# Patient Record
Sex: Female | Born: 1995 | Race: White | Hispanic: No | Marital: Single | Smoking: Never smoker
Health system: Southern US, Community
[De-identification: ages and names within clinical notes are randomized; demographics above are authoritative.]

## PROBLEM LIST (undated history)

## (undated) DIAGNOSIS — D649 Anemia, unspecified: Secondary | ICD-10-CM

## (undated) HISTORY — PX: KIDNEY SURGERY: SHX687

## (undated) HISTORY — DX: Anemia, unspecified: D64.9

---
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
## 2016-08-10 DIAGNOSIS — K2100 Gastro-esophageal reflux disease with esophagitis, without bleeding: Secondary | ICD-10-CM | POA: Insufficient documentation

## 2016-11-22 ENCOUNTER — Ambulatory Visit: Payer: Self-pay | Admitting: Physician Assistant

## 2016-11-22 ENCOUNTER — Encounter: Payer: Self-pay | Admitting: Physician Assistant

## 2016-11-22 ENCOUNTER — Ambulatory Visit (INDEPENDENT_AMBULATORY_CARE_PROVIDER_SITE_OTHER): Payer: BLUE CROSS/BLUE SHIELD | Admitting: Physician Assistant

## 2016-11-22 VITALS — BP 104/78 | HR 84 | Temp 98.7°F | Resp 16 | Wt 178.0 lb

## 2016-11-22 DIAGNOSIS — R42 Dizziness and giddiness: Secondary | ICD-10-CM

## 2016-11-22 DIAGNOSIS — K602 Anal fissure, unspecified: Secondary | ICD-10-CM | POA: Diagnosis not present

## 2016-11-22 DIAGNOSIS — K644 Residual hemorrhoidal skin tags: Secondary | ICD-10-CM

## 2016-11-22 NOTE — Progress Notes (Signed)
Patient: Alexandra Haney Female    DOB: 05/31/1996   20 y.o.   MRN: [id number] Visit Date: 11/22/2016  Today's Provider: Trey SailorsAdriana M Pollak, PA-C   Chief Complaint  Patient presents with  . Hemorrhoids  . Constipation   Subjective:      Hemorrhoids: Patient complains of evaluation of possible hemorrhoids. Onset of symptoms was gradual starting several years ago ago with gradually worsening course since that time.  She describes symptoms as anorectal itching, bleeding which only occurs with bowel movements and constipation. Treatment to date has been OTC creams: temporarily effective. Patient denies family hx of colorectal CA, history of previous STDs, known or suspected STD exposure, maroon colored stools, melena, receptive anal intercourse and weight loss.  No family history of GI malignancy. No personal history of weight loss, fevers, chills, night sweats. Blood is never in stool, only in toilet or on tissue. Bleeding never persistent and usually resolves in 1-2 days.   Constipation  This is a chronic problem. The current episode started more than 1 year ago. The problem has been gradually worsening since onset. The patient is on a high fiber diet. She does not exercise regularly. There has been adequate water intake. Associated symptoms include abdominal pain, bloating, hemorrhoids and rectal pain. Pertinent negatives include no diarrhea, fever, nausea or vomiting. She has tried laxatives, fiber and diet changes for the symptoms. The treatment provided no relief.      Allergies  Allergen Reactions  . Cephalosporins Hives and Itching     Current Outpatient Prescriptions:  .  ranitidine (ZANTAC) 150 MG tablet, Take 150 mg by mouth as needed for heartburn., Disp: , Rfl:   Review of Systems  Constitutional: Positive for appetite change and fatigue. Negative for activity change, chills, diaphoresis, fever and unexpected weight change.  HENT: Negative for sore throat and  trouble swallowing.   Gastrointestinal: Positive for abdominal distention, abdominal pain, bloating, constipation, hemorrhoids and rectal pain. Negative for anal bleeding, blood in stool, diarrhea, nausea and vomiting.  Endocrine: Positive for heat intolerance, polydipsia and polyuria. Negative for cold intolerance and polyphagia.  Musculoskeletal: Negative for neck pain and neck stiffness.  Neurological: Positive for dizziness and headaches.    Social History  Substance Use Topics  . Smoking status: Never Smoker  . Smokeless tobacco: Never Used  . Alcohol use No   Objective:   BP 104/78 (BP Location: Left Arm, Patient Position: Sitting, Cuff Size: Normal)   Pulse 84   Temp 98.7 F (37.1 C) (Oral)   Resp 16   Wt 178 lb (80.7 kg)   LMP 11/14/2016   Physical Exam  Constitutional: She appears well-developed and well-nourished.  Cardiovascular: Normal rate.   Abdominal: Soft. Bowel sounds are normal. She exhibits no distension. There is no tenderness. There is no rebound and no guarding.  Genitourinary: Rectal exam shows external hemorrhoid and fissure.    Pelvic exam was performed with patient in the knee-chest position.  Nursing note and vitals reviewed.       Assessment & Plan:      Problem List Items Addressed This Visit    None    Visit Diagnoses    Dizziness    -  Primary   Relevant Orders   CBC   External hemorrhoid       Anal fissure         Patient is 20 y/o female presenting with anal fissue and external hemorrhoid. Hemorrhoid and anal fissure  visualized on exam. Patient declined to proceed with DRE after these visualized. No red flag symptoms such as weight loss, fever, chills, nausea, vomiting. Bleeding likely from the above diagnoses, exacerbated by constipation. Patient reports high fiber diet. Recommend miralax for 2 weeks during hemorrhoid flares. Otherwise, maintain high fiber diet and increased water intake. Counseled on warning symptoms needing further  workup. Will get CBC to check hemoglobin. Patient may do sitz bath for pain relief.   Return if symptoms worsen or fail to improve.   Patient Instructions      Anal Fissure, Adult Introduction An anal fissure is a small tear or crack in the skin around the opening of the butt (anus).Bleeding from the tear or crack usually stops on its own within a few minutes. The bleeding may happen every time you poop (have a bowel movement) until the tear or crack heals. Follow these instructions at home: Eating and drinking  Avoid bananas and dairy products. These foods can make it hard to poop.  Drink enough fluid to keep your pee (urine) clear or pale yellow.  Eat a lot of fruit, whole grains, and vegetables. General instructions  Keep the butt area as clean and dry as you can.  Take a warm water bath (sitz bath) as told by your doctor. Do not use soap.  Take over-the-counter and prescription medicines only as told by your doctor.  Use creams or ointments only as told by your doctor.  Keep all follow-up visits as told by your doctor. This is important. Contact a doctor if:  You have more bleeding.  You have a fever.  You have watery poop (diarrhea) that is mixed with blood.  You have pain.  You problem gets worse, not better. This information is not intended to replace advice given to you by your health care provider. Make sure you discuss any questions you have with your health care provider. Document Released: 08/10/2011 Document Revised: 05/19/2016 Document Reviewed: 03/09/2015  2017 Elsevier    The entirety of the information documented in the History of Present Illness, Review of Systems and Physical Exam were personally obtained by me. Portions of this information were initially documented by Kavin LeechLaura Sarika Baldini, CMA and reviewed by me for thoroughness and accuracy.           Trey SailorsAdriana M Pollak, PA-C  [facility]

## 2016-11-22 NOTE — Patient Instructions (Signed)
Anal Fissure, Adult Introduction An anal fissure is a small tear or crack in the skin around the opening of the butt (anus).Bleeding from the tear or crack usually stops on its own within a few minutes. The bleeding may happen every time you poop (have a bowel movement) until the tear or crack heals. Follow these instructions at home: Eating and drinking  Avoid bananas and dairy products. These foods can make it hard to poop.  Drink enough fluid to keep your pee (urine) clear or pale yellow.  Eat a lot of fruit, whole grains, and vegetables. General instructions  Keep the butt area as clean and dry as you can.  Take a warm water bath (sitz bath) as told by your doctor. Do not use soap.  Take over-the-counter and prescription medicines only as told by your doctor.  Use creams or ointments only as told by your doctor.  Keep all follow-up visits as told by your doctor. This is important. Contact a doctor if:  You have more bleeding.  You have a fever.  You have watery poop (diarrhea) that is mixed with blood.  You have pain.  You problem gets worse, not better. This information is not intended to replace advice given to you by your health care provider. Make sure you discuss any questions you have with your health care provider. Document Released: 08/10/2011 Document Revised: 05/19/2016 Document Reviewed: 03/09/2015  2017 Elsevier  

## 2016-11-23 ENCOUNTER — Telehealth: Payer: Self-pay

## 2016-11-23 LAB — CBC
Hematocrit: 39.8 % (ref 34.0–46.6)
Hemoglobin: 13.1 g/dL (ref 11.1–15.9)
MCH: 28.1 pg (ref 26.6–33.0)
MCHC: 32.9 g/dL (ref 31.5–35.7)
MCV: 85 fL (ref 79–97)
Platelets: 268 10*3/uL (ref 150–379)
RBC: 4.67 x10E6/uL (ref 3.77–5.28)
RDW: 13 % (ref 12.3–15.4)
WBC: 10.3 10*3/uL (ref 3.4–10.8)

## 2016-11-23 NOTE — Telephone Encounter (Signed)
[id number] 

## 2016-11-23 NOTE — Telephone Encounter (Signed)
Patients mother (listed on DPR Jeanene ErbMichelle Joy) was advised of lab report. KW

## 2016-11-23 NOTE — Telephone Encounter (Signed)
-----   Message from Trey SailorsAdriana M Pollak, [place]-C sent at 11/23/2016  8:14 AM EST ----- Blood count was normal. Patient should continue with fiber, Miralax, and Sitz baths as we talked about in office. Patient may call back if symptoms persist or worsen.

## 2018-04-02 ENCOUNTER — Ambulatory Visit (INDEPENDENT_AMBULATORY_CARE_PROVIDER_SITE_OTHER): Payer: BLUE CROSS/BLUE SHIELD | Admitting: Certified Nurse Midwife

## 2018-04-02 ENCOUNTER — Encounter: Payer: Self-pay | Admitting: Certified Nurse Midwife

## 2018-04-02 VITALS — BP 93/67 | HR 66 | Ht 63.0 in | Wt 149.4 lb

## 2018-04-02 DIAGNOSIS — N898 Other specified noninflammatory disorders of vagina: Secondary | ICD-10-CM

## 2018-04-02 DIAGNOSIS — Z0000 Encounter for general adult medical examination without abnormal findings: Secondary | ICD-10-CM

## 2018-04-02 MED ORDER — TRANEXAMIC ACID 650 MG PO TABS: 1300.0000 mg | ORAL_TABLET | Freq: Three times a day (TID) | ORAL | 0 refills | Status: AC

## 2018-04-02 NOTE — Progress Notes (Signed)
New pt is here with c/o severe abdominal cramping right before during the first few days of period.

## 2018-04-02 NOTE — Patient Instructions (Addendum)
Preventive Care 18-39 Years, Female Preventive care refers to lifestyle choices and visits with your health care provider that can promote health and wellness. What does preventive care include?  A yearly physical exam. This is also called an annual well check.  Dental exams once or twice a year.  Routine eye exams. Ask your health care provider how often you should have your eyes checked.  Personal lifestyle choices, including: ? Daily care of your teeth and gums. ? Regular physical activity. ? Eating a healthy diet. ? Avoiding tobacco and drug use. ? Limiting alcohol use. ? Practicing safe sex. ? Taking vitamin and mineral supplements as recommended by your health care provider. What happens during an annual well check? The services and screenings done by your health care provider during your annual well check will depend on your age, overall health, lifestyle risk factors, and family history of disease. Counseling Your health care provider may ask you questions about your:  Alcohol use.  Tobacco use.  Drug use.  Emotional well-being.  Home and relationship well-being.  Sexual activity.  Eating habits.  Work and work Statistician.  Method of birth control.  Menstrual cycle.  Pregnancy history.  Screening You may have the following tests or measurements:  Height, weight, and BMI.  Diabetes screening. This is done by checking your blood sugar (glucose) after you have not eaten for a while (fasting).  Blood pressure.  Lipid and cholesterol levels. These may be checked every 5 years starting at age 66.  Skin check.  Hepatitis C blood test.  Hepatitis B blood test.  Sexually transmitted disease (STD) testing.  BRCA-related cancer screening. This may be done if you have a family history of breast, ovarian, tubal, or peritoneal cancers.  Pelvic exam and Pap test. This may be done every 3 years starting at age 40. Starting at age 59, this may be done every 5  years if you have a Pap test in combination with an HPV test.  Discuss your test results, treatment options, and if necessary, the need for more tests with your health care provider. Vaccines Your health care provider may recommend certain vaccines, such as:  Influenza vaccine. This is recommended every year.  Tetanus, diphtheria, and acellular pertussis (Tdap, Td) vaccine. You may need a Td booster every 10 years.  Varicella vaccine. You may need this if you have not been vaccinated.  HPV vaccine. If you are 69 or younger, you may need three doses over 6 months.  Measles, mumps, and rubella (MMR) vaccine. You may need at least one dose of MMR. You may also need a second dose.  Pneumococcal 13-valent conjugate (PCV13) vaccine. You may need this if you have certain conditions and were not previously vaccinated.  Pneumococcal polysaccharide (PPSV23) vaccine. You may need one or two doses if you smoke cigarettes or if you have certain conditions.  Meningococcal vaccine. One dose is recommended if you are age 27-21 years and a first-year college student living in a residence hall, or if you have one of several medical conditions. You may also need additional booster doses.  Hepatitis A vaccine. You may need this if you have certain conditions or if you travel or work in places where you may be exposed to hepatitis A.  Hepatitis B vaccine. You may need this if you have certain conditions or if you travel or work in places where you may be exposed to hepatitis B.  Haemophilus influenzae type b (Hib) vaccine. You may need this if  you have certain risk factors.  Talk to your health care provider about which screenings and vaccines you need and how often you need them. This information is not intended to replace advice given to you by your health care provider. Make sure you discuss any questions you have with your health care provider. Document Released: 02/07/2002 Document Revised: 08/31/2016  Document Reviewed: 10/13/2015 Elsevier Interactive Patient Education  2018 Elsevier Inc.  Preventing Cervical Cancer Cervical cancer is cancer that grows on the cervix. The cervix is at the bottom of the uterus. It connects the uterus to the vagina. The uterus is where a baby develops during pregnancy. Cancer occurs when cells become abnormal and start to grow out of control. Cervical cancer grows slowly and may not cause any symptoms at first. Over time, the cancer can grow deep into the cervix tissue and spread to other areas. If it is found early, cervical cancer can be treated effectively. You can also take steps to prevent this type of cancer. Most cases of cervical cancer are caused by an STI (sexually transmitted infection) called human papillomavirus (HPV). One way to reduce your risk of cervical cancer is to avoid infection with the HPV virus. You can do this by practicing safe sex and by getting the HPV vaccine. Getting regular Pap tests is also important because this can help identify changes in cells that could lead to cancer. Your chances of getting this disease can also be reduced by making certain lifestyle changes. How can I protect myself from cervical cancer? Preventing HPV infection  Ask your health care provider about getting the HPV vaccine. If you are 39 years old or younger, you may need to get this vaccine, which is given in three doses over 6 months. This vaccine protects against the types of HPV that could cause cancer.  Limit the number of people you have sex with. Also avoid having sex with people who have had many sex partners.  Use a latex condom during sex. Getting Pap tests  Get Pap tests regularly, starting at age 72. Talk with your health care provider about how often you need these tests. ? Most women who are 33?21 years of age should have a Pap test every 3 years. ? Most women who are 58?21 years of age should have a Pap test in combination with an HPV test  every 5 years. ? Women with a higher risk of cervical cancer, such as those with a weakened immune system or those who have been exposed to the drug diethylstilbestrol (DES), may need more frequent testing. Making other lifestyle changes  Do not use any products that contain nicotine or tobacco, such as cigarettes and e-cigarettes. If you need help quitting, ask your health care provider.  Eat at least 5 servings of fruits and vegetables every day.  Lose weight if you are overweight. Why are these changes important?  These changes and screening tests are designed to address the factors that are known to increase the risk of cervical cancer. Taking these steps is the best way to reduce your risk.  Having regular Pap tests will help identify changes in cells that could lead to cancer. Steps can then be taken to prevent cancer from developing.  These changes will also help find cervical cancer early. This type of cancer can be treated effectively if it is found early. It can be more dangerous and difficult to treat if cancer has grown deep into your cervix or has spread.  In  addition to making you less likely to get cervical cancer, these changes will also provide other health benefits, such as the following: ? Practicing safe sex is important for preventing STIs and unplanned pregnancies. ? Avoiding tobacco can reduce your risk for other cancers and health issues. ? Eating a healthy diet and maintaining a healthy weight are good for your overall health. What can happen if changes are not made? In the early stages, cervical cancer might not have any symptoms. It can take many years for the cancer to grow and get deep into the cervix tissue. This may be happening without you knowing about it. If you develop any symptoms, such as pelvic pain or unusual discharge or bleeding from your vagina, you should see your health care provider right away. If cervical cancer is not found early, you might need  treatments such as radiation, chemotherapy, or surgery. In some cases, surgery may mean that you will not be able to get pregnant or carry a pregnancy to term. Where to find support: Talk with your health care provider, school nurse, or local health department for guidance about screening and vaccination. Some children and teens may be able to get the HPV vaccine free of charge through the U.S. government's Vaccines for Children (VFC) program. Other places that provide vaccinations include:  Public health clinics. Check with your local health department.  Federally Qualified Health Centers, where you would pay only what you can afford. To find one near you, check this website: www.fqhc.org/find-an-fqhc/  Rural Health Clinics. These are part of a program for Medicare and Medicaid patients who live in rural areas.  The National Breast and Cervical Cancer Early Detection Program also provides breast and cervical cancer screenings and diagnostic services to low-income, uninsured, and underinsured women. Cervical cancer can be passed down through families. Talk with your health care provider or genetic counselor to learn more about genetic testing for cancer. Where to find more information: Learn more about cervical cancer from:  American College of Gynecology: www.acog.org/Patients/FAQs/Cervical-Cancer  American Cancer Society: www.cancer.org/cancer/cervicalcancer/  U.S. Centers for Disease Control and Prevention: www.cdc.gov/cancer/cervical/  Summary  Talk with your health care provider about getting the HPV vaccine.  Be sure to get regular Pap tests as recommended by your health care provider.  See your health care provider right away if you have any pelvic pain or unusual discharge or bleeding from your vagina. This information is not intended to replace advice given to you by your health care provider. Make sure you discuss any questions you have with your health care provider. Document  Released: 12/27/2015 Document Revised: 08/09/2016 Document Reviewed: 08/09/2016 Elsevier Interactive Patient Education  2018 Elsevier Inc.  

## 2018-04-02 NOTE — Progress Notes (Signed)
GYNECOLOGY ANNUAL PREVENTATIVE CARE ENCOUNTER NOTE  Subjective:   Alexandra Haney is a 22 y.o. No obstetric history on file. female here for a routine annual gynecologic exam.  Current complaints: heavy regular painful periods.   Denies abnormal vaginal bleeding, discharge, pelvic pain, problems with intercourse or other gynecologic concerns.    Gynecologic History Patient's last menstrual period was 03/19/2018 (approximate). Contraception: none Last Pap: n/a. Last mammogram: n/a  Obstetric History OB History  Gravida Para Term Preterm AB Living  2       2    SAB TAB Ectopic Multiple Live Births               # Outcome Date GA Lbr Len/2nd Weight Sex Delivery Anes PTL Lv  2 AB 2017          1 AB 2016            History reviewed. No pertinent past medical history.  Past Surgical History:  Procedure Laterality Date  . KIDNEY SURGERY    . KIDNEY SURGERY     reflux    No current outpatient medications on file prior to visit.   No current facility-administered medications on file prior to visit.     Allergies  Allergen Reactions  . Cephalosporins Hives and Itching    Social History   Socioeconomic History  . Marital status: Single    Spouse name: Not on file  . Number of children: Not on file  . Years of education: Not on file  . Highest education level: Not on file  Occupational History  . Not on file  Social Needs  . Financial resource strain: Not on file  . Food insecurity:    Worry: Not on file    Inability: Not on file  . Transportation needs:    Medical: Not on file    Non-medical: Not on file  Tobacco Use  . Smoking status: Never Smoker  . Smokeless tobacco: Never Used  Substance and Sexual Activity  . Alcohol use: No  . Drug use: No  . Sexual activity: Not Currently    Birth control/protection: None  Lifestyle  . Physical activity:    Days per week: Not on file    Minutes per session: Not on file  . Stress: Not on file  Relationships  .  Social connections:    Talks on phone: Not on file    Gets together: Not on file    Attends religious service: Not on file    Active member of club or organization: Not on file    Attends meetings of clubs or organizations: Not on file    Relationship status: Not on file  . Intimate partner violence:    Fear of current or ex partner: Not on file    Emotionally abused: Not on file    Physically abused: Not on file    Forced sexual activity: Not on file  Other Topics Concern  . Not on file  Social History Narrative  . Not on file    Family History  Problem Relation Age of Onset  . Endometriosis Mother   . Fibroids Mother   . Diabetes Mother   . Cancer Maternal Aunt     The following portions of the patient's history were reviewed and updated as appropriate: allergies, current medications, past family history, past medical history, past social history, past surgical history and problem list.  Review of Systems Pertinent items noted in HPI and remainder of comprehensive  ROS otherwise negative.   Objective:  BP 93/67   Pulse 66   Ht 5\' 3"  (1.6 m)   Wt 149 lb 6 oz (67.8 kg)   LMP 03/19/2018 (Approximate)   BMI 26.46 kg/m  CONSTITUTIONAL: Well-developed, well-nourished female in no acute distress.  HENT:  Normocephalic, atraumatic, External right and left ear normal. Oropharynx is clear and moist EYES: Conjunctivae and EOM are normal. Pupils are equal, round, and reactive to light. No scleral icterus.  NECK: Normal range of motion, supple, no masses.  Normal thyroid.  SKIN: Skin is warm and dry. No rash noted. Not diaphoretic. No erythema. No pallor. NEUROLOGIC: Alert and oriented to person, place, and time. Normal reflexes, muscle tone coordination. No cranial nerve deficit noted. PSYCHIATRIC: Normal mood and affect. Normal behavior. Normal judgment and thought content. CARDIOVASCULAR: Normal heart rate noted, regular rhythm RESPIRATORY: Clear to auscultation bilaterally.  Effort and breath sounds normal, no problems with respiration noted. BREASTS: Symmetric in size. No masses, skin changes, nipple drainage, or lymphadenopathy. ABDOMEN: Soft, normal bowel sounds, no distention noted.  No tenderness, rebound or guarding.  PELVIC: Normal appearing external genitalia; normal appearing vaginal mucosa and cervix.  No abnormal discharge noted.  Pap smear obtained.  Normal uterine size, no other palpable masses, no uterine or adnexal tenderness. MUSCULOSKELETAL: Normal range of motion. No tenderness.  No cyanosis, clubbing, or edema.  2+ distal pulses.   Assessment and Plan:  Annual physical exam Discussed possibility of endometriosis given her symptoms and family history. She states hare periods are debilitating. They cause her to be in bed for the first 2-3 days. Reviewed treatment options. She declines birth control stating that she does not want to gain weight. Reviewed weight gain with contraceptive methods found only with Depo. Reviewed benefits and risks of  Birth control. Reviewed dx for endometriosis made with lap. procedure. Encouraged her to make an appointment with MD to discuss this further. She verbalizes understanding. Pap smear not indicted today. Pt mother expressed concern about not having it done today because of her aunts history. Reassurance given and recommendations reviewed on screening. Discussed use of lysteda for  Heavy bleeding. Would like to try . She denies any contraindications for use. Order placed. Mammogram not indicated. Routine preventative health maintenance measures emphasized. Pt states that she has had HPV vaccines. Please refer to After Visit Summary for other counseling recommendations.   Doreene BurkeAnnie Taesean Reth, CNM

## 2018-04-05 LAB — NUSWAB VAGINITIS PLUS (VG+)
ATOPOBIUM VAGINAE: HIGH {score} — AB
CANDIDA ALBICANS, NAA: NEGATIVE
CANDIDA GLABRATA, NAA: NEGATIVE
Chlamydia trachomatis, NAA: NEGATIVE
Megasphaera 1: HIGH Score — AB
NEISSERIA GONORRHOEAE, NAA: NEGATIVE
Trich vag by NAA: NEGATIVE

## 2018-04-06 ENCOUNTER — Telehealth: Payer: Self-pay

## 2018-04-06 ENCOUNTER — Telehealth: Payer: Self-pay | Admitting: Certified Nurse Midwife

## 2018-04-06 ENCOUNTER — Other Ambulatory Visit: Payer: Self-pay | Admitting: Certified Nurse Midwife

## 2018-04-06 MED ORDER — METRONIDAZOLE 500 MG PO TABS: 500.0000 mg | ORAL_TABLET | Freq: Two times a day (BID) | ORAL | 0 refills | Status: AC

## 2018-04-06 NOTE — Telephone Encounter (Signed)
The patients mother called and stated that she would like to speak with Jasmine DecemberSharon to explain the patients results, she does not understand. Please advise.

## 2018-04-06 NOTE — Progress Notes (Signed)
Pt nuswab came back positive for BV . Prescription sent in to pharmacy on file . Pt notified by RN.   Doreene BurkeAnnie Cris Talavera, CNM

## 2018-04-06 NOTE — Telephone Encounter (Signed)
Message left for pt- per provider positive swab BV script at pharmacy.

## 2018-04-06 NOTE — Telephone Encounter (Signed)
Spoke with pts mother- offered to send a printout explaining the diagnosis. Mother agreed. [facility] [employer] [employer] and she stated she asked one of the nurses there to explain it to her. Will call with any further questions or concerns.

## 2018-09-17 ENCOUNTER — Ambulatory Visit (INDEPENDENT_AMBULATORY_CARE_PROVIDER_SITE_OTHER): Payer: BLUE CROSS/BLUE SHIELD | Admitting: Family Medicine

## 2018-09-17 ENCOUNTER — Encounter: Payer: Self-pay | Admitting: Family Medicine

## 2018-09-17 VITALS — BP 94/68 | HR 103 | Temp 98.5°F | Wt 137.8 lb

## 2018-09-17 DIAGNOSIS — N3091 Cystitis, unspecified with hematuria: Secondary | ICD-10-CM

## 2018-09-17 LAB — POCT URINALYSIS DIPSTICK
GLUCOSE UA: NEGATIVE
KETONES UA: 40
NITRITE UA: POSITIVE
Protein, UA: POSITIVE — AB
SPEC GRAV UA: 1.025 (ref 1.010–1.025)
Urobilinogen, UA: 0.2 E.U./dL
pH, UA: 6 (ref 5.0–8.0)

## 2018-09-17 MED ORDER — SULFAMETHOXAZOLE-TRIMETHOPRIM 800-160 MG PO TABS: 1.0000 | ORAL_TABLET | Freq: Two times a day (BID) | ORAL | 0 refills | Status: AC

## 2018-09-17 NOTE — Patient Instructions (Signed)

## 2018-09-17 NOTE — Progress Notes (Signed)
Patient: Alexandra Haney Female    DOB: 07/13/1996   21 y.o.   MRN: [id number] Visit Date: 09/17/2018  Today's Provider: Shirlee LatchAngela Moncia Annas, MD   Chief Complaint  Patient presents with  . Dysuria   Subjective:    I, Presley RaddleNikki Walston, CMA, am acting as a scribe for Shirlee LatchAngela Lamija Besse, MD.   Dysuria   This is a new problem. Episode onset: Saturday. The problem occurs every urination. The problem has been unchanged. The quality of the pain is described as burning. There has been no fever. Associated symptoms include a discharge, hematuria and nausea. Pertinent negatives include no sweats, urgency or vomiting. Associated symptoms comments: Pelvic pain . She has tried nothing for the symptoms. Her past medical history is significant for recurrent UTIs (as a child. Bladder surgery when she was 6613-22 years old).   Her mother is worried because there is a family history of kidney stones, pyelonephritis, and CKD.   Allergies  Allergen Reactions  . Cephalosporins Hives and Itching    No current outpatient medications on file.  Review of Systems  Constitutional: Negative.   Respiratory: Negative.   Cardiovascular: Negative.   Gastrointestinal: Positive for nausea. Negative for vomiting.  Genitourinary: Positive for dysuria and hematuria. Negative for urgency.  Musculoskeletal: Negative.     Social History   Tobacco Use  . Smoking status: Never Smoker  . Smokeless tobacco: Never Used  Substance Use Topics  . Alcohol use: No   Objective:   BP 94/68 (BP Location: Right Arm, Patient Position: Sitting, Cuff Size: Normal)   Pulse (!) 103   Temp 98.5 F (36.9 C) (Oral)   Wt 137 lb 12.8 oz (62.5 kg)   LMP 08/19/2018   SpO2 99%   BMI 24.41 kg/m  Vitals:   09/17/18 1452  BP: 94/68  Pulse: (!) 103  Temp: 98.5 F (36.9 C)  TempSrc: Oral  SpO2: 99%  Weight: 137 lb 12.8 oz (62.5 kg)     Physical Exam  Constitutional: She is oriented to person, place, and time. She appears  well-developed and well-nourished. No distress.  HENT:  Head: Normocephalic and atraumatic.  Eyes: Conjunctivae are normal.  Cardiovascular: Normal rate, regular rhythm, normal heart sounds and intact distal pulses.  No murmur heard. Pulmonary/Chest: Effort normal and breath sounds normal. No respiratory distress. She has no wheezes. She has no rales.  Abdominal: Soft. Bowel sounds are normal. She exhibits no distension. There is tenderness in the suprapubic area. There is no rebound, no guarding, no CVA tenderness and no tenderness at McBurney's point.  Musculoskeletal: She exhibits no edema.  Neurological: She is alert and oriented to person, place, and time.  Skin: Skin is warm and dry. Capillary refill takes less than 2 seconds. No rash noted.  Psychiatric: She has a normal mood and affect. Her behavior is normal.  Vitals reviewed.   Results for orders placed or performed in visit on 09/17/18  POCT Urinalysis Dipstick  Result Value Ref Range   Color, UA dark yellow    Clarity, UA hazy    Glucose, UA Negative Negative   Bilirubin, UA small    Ketones, UA 40    Spec Grav, UA 1.025 1.010 - 1.025   Blood, UA hemolyzed large    pH, UA 6.0 5.0 - 8.0   Protein, UA Positive (A) Negative   Urobilinogen, UA 0.2 0.2 or 1.0 E.U./dL   Nitrite, UA positive    Leukocytes, UA Moderate (2+) (A) Negative  Appearance     Odor         Assessment & Plan:   1. Cystitis with hematuria - UA c/w UTI - there is large blood but no signs of nephrolithiasis on exam - micro to confirm hematuria -Suspect hematuria is from cystitis, but should have repeat UA in about 6 weeks to ensure it has resolved - No CVA tenderness or other signs of more systemic illness or pyelonephritis - Send urine culture to confirm and check sensitivities -Treat with Bactrim x5 days - Discussed strict return precautions - POCT Urinalysis Dipstick - Urine Culture - Urinalysis, microscopic only    Meds ordered this  encounter  Medications  . sulfamethoxazole-trimethoprim (BACTRIM DS,SEPTRA DS) 800-160 MG tablet    Sig: Take 1 tablet by mouth 2 (two) times daily for 5 days.    Dispense:  10 tablet    Refill:  0     Return if symptoms worsen or fail to improve.   The entirety of the information documented in the History of Present Illness, Review of Systems and Physical Exam were personally obtained by me. Portions of this information were initially documented by Presley Raddle, CMA and reviewed by me for thoroughness and accuracy.    Erasmo Downer, MD, MPH [facility] 09/17/2018 3:25 PM

## 2018-09-18 ENCOUNTER — Telehealth: Payer: Self-pay

## 2018-09-18 LAB — URINALYSIS, MICROSCOPIC ONLY
CASTS: NONE SEEN /LPF
RBC, UA: >30 /hpf — AB (ref 0–2)
WBC, UA: >30 /hpf — AB (ref 0–5)

## 2018-09-18 NOTE — Telephone Encounter (Signed)
-----   Message from Erasmo DownerAngela M Bacigalupo, MD sent at 09/18/2018 10:46 AM EDT ----- Urine has large amounts of blood and white blood cells.  Mucus is also present which is likely related to infection.  There are also calcium oxalate crystals, which are typically seen from kidney stones.  She may have passed some small kidney stones as well.  Again, if pain does not resolve with antibiotics, please let us know and we may need to do a CT scan to look for further stones.  Urine culture still pending  Erasmo DownerBacigalupo, Angela M, MD, MPH [facility] 09/18/2018 10:45 AM

## 2018-09-18 NOTE — Telephone Encounter (Signed)
Attempted to contact patient. No answer or voicemail.  

## 2018-09-18 NOTE — Telephone Encounter (Signed)
Patient's mother [place] advised

## 2018-09-19 LAB — URINE CULTURE

## 2019-06-25 ENCOUNTER — Encounter: Payer: Self-pay | Admitting: Physician Assistant

## 2019-06-25 ENCOUNTER — Ambulatory Visit (INDEPENDENT_AMBULATORY_CARE_PROVIDER_SITE_OTHER): Payer: BC Managed Care – PPO | Admitting: Physician Assistant

## 2019-06-25 ENCOUNTER — Other Ambulatory Visit: Payer: Self-pay

## 2019-06-25 VITALS — BP 128/85 | HR 97 | Temp 98.1°F | Resp 16 | Ht 63.0 in | Wt 138.8 lb

## 2019-06-25 DIAGNOSIS — K130 Diseases of lips: Secondary | ICD-10-CM | POA: Diagnosis not present

## 2019-06-25 DIAGNOSIS — R21 Rash and other nonspecific skin eruption: Secondary | ICD-10-CM

## 2019-06-25 MED ORDER — MICONAZOLE NITRATE 2 % EX CREA: 1.0000 "application " | TOPICAL_CREAM | Freq: Two times a day (BID) | CUTANEOUS | 0 refills | Status: DC

## 2019-06-25 MED ORDER — TRIAMCINOLONE ACETONIDE 0.1 % EX CREA: 1.0000 "application " | TOPICAL_CREAM | Freq: Two times a day (BID) | CUTANEOUS | 0 refills | Status: DC

## 2019-06-25 NOTE — Progress Notes (Signed)
       Patient: Alexandra Haney Female    DOB: [birth year]   23 y.o.   MRN: [id number] Visit Date: 06/25/2019  Today's Provider: Trinna Post, PA-C   Chief Complaint  Patient presents with  . Rash   Subjective:     HPI Patient here today c/o rash on her back and around her mouth x's 3 weeks.  Patient reports itchy red rash on her neck that flares in the sun. She is using OTC allergy medication and hydrocortisone, which did help. Patient reports rash on her back is very itchy, painful, and swells up when she is in the sun. Reports she just recently started using a pool and also a new face lotion. Reports the rash felt like sunburn and itch and hurt. Sister with psoriasis but she has never had issues.   Reports rash at he corner of her mouth that is dry and scaling.    Allergies  Allergen Reactions  . Cephalosporins Hives and Itching    No current outpatient medications on file.  Review of Systems  Constitutional: Negative.   Cardiovascular: Negative.   Skin: Positive for rash.    Social History   Tobacco Use  . Smoking status: Never Smoker  . Smokeless tobacco: Never Used  Substance Use Topics  . Alcohol use: No      Objective:   BP 128/85 (BP Location: Right Arm, Patient Position: Sitting, Cuff Size: Large)   Pulse 97   Temp 98.1 F (36.7 C) (Oral)   Resp 16   Ht 5\' 3"  (1.6 m)   Wt 138 lb 12.8 oz (63 kg)   LMP 06/25/2019 (Exact Date)   SpO2 98%   BMI 24.59 kg/m  Vitals:   06/25/19 1548  BP: 128/85  Pulse: 97  Resp: 16  Temp: 98.1 F (36.7 C)  TempSrc: Oral  SpO2: 98%  Weight: 138 lb 12.8 oz (63 kg)  Height: 5\' 3"  (1.6 m)     Physical Exam Constitutional:      Appearance: Normal appearance.  HENT:     Mouth/Throat:     Comments: Dry, scaling patches on corners of mouth.  Skin:    General: Skin is warm and dry.     Comments: No rash present on back of neck.   Neurological:     Mental Status: She is alert.      No results found for  any visits on 06/25/19.     Assessment & Plan    1. Angular cheilitis  - miconazole (MICATIN) 2 % cream; Apply 1 application topically 2 (two) times daily.  Dispense: 28.35 g; Refill: 0  2. Skin rash  Polymorphic light eruption vs. Contact dermatitis. Rash is not present today. May use steroid cream PRN, wear sun screen and protective clothing.   - triamcinolone cream (KENALOG) 0.1 %; Apply 1 application topically 2 (two) times daily.  Dispense: 30 g; Refill: 0  The entirety of the information documented in the History of Present Illness, Review of Systems and Physical Exam were personally obtained by me. Portions of this information were initially documented by Lynford Humphrey, CMA and reviewed by me for thoroughness and accuracy.   F/u PRN     Trinna Post, PA-C  [facility]

## 2019-06-25 NOTE — Patient Instructions (Addendum)
Angular cheilitis is a condition that causes red, swollen patches in the corners of your mouth where your lips meet and make an angle. Other names for it are perleche and angular stomatitis. You can get it on one side of your mouth or on both sides at the same time.  Symptoms The main things you'll notice are irritation and soreness in the corner(s) of your mouth. One or both corners may be:  Bleeding Blistered Cracked Crusty Itchy Painful Red Scaly Swollen angular cheilitisYour lips can feel dry and uncomfortable. Sometimes your lips and mouth can feel like they're burning. You also might have a bad taste in your mouth.  If the irritation is strong, it can make it hard for you to eat. You may not get enough nutrients or you may lose weight.  What is polymorphic light eruption? Polymorphic light eruption, also known as polymorphous light eruption, PMLE, and prurigo Alverda Skeans, is a common form of primary photosensitivity that mainly occurs in young adult women in temperate climates during spring and summer.  'Polymorphic' refers to the fact that the rash can take many forms, although in one individual it usually looks the same every time it appears.   Who gets polymorphic light eruption? PMLE generally affects adult females aged 20-40, although it sometimes affects children and males in 25% of cases. It is particularly common in places where sun exposure is uncommon, such as [place], where it is said to affect 10-20% of women holidaying in the [place] area. It is less common in [place]. It has also been reported to be relatively common at higher altitudes compared to sea level.  PMLE can affect all skin phototypes, though it is more often diagnosed in white skin than in skin of colour. There is a genetic tendency to PMLE, and it is sometimes associated with or confused with photosensitivity due to lupus erythematosus, which generally is more persistent than PMLE.  Some  patients experience PMLE during phototherapy, which is used to treat skin conditions such as psoriasis and dermatitis.  What causes polymorphic light eruption? Genetic factors may be important with many affected individuals reporting a family history of PMLE.   PMLE is caused by a delayed hypersensitivity reaction to a compound in the skin that is altered by exposure to ultraviolet radiation (UVR). UVR leads to impaired T-cell function and altered production of cytokines in affected individuals. There is a reduction in the normal UV-induced immune suppression in the skin. This has been suggested to be related to oestrogen, insufficiency of vitamin D, or to the microbiome and antimicrobial factors.  The rash is usually provoked by UVA in 75-90% of cases. This means the rash can occur when the sunlight is coming through window glass, and that standard sunscreens may not prevent it. Occasionally, UVB and/or visible light provoke PMLE.

## 2019-10-31 DIAGNOSIS — R638 Other symptoms and signs concerning food and fluid intake: Secondary | ICD-10-CM | POA: Diagnosis not present

## 2019-10-31 DIAGNOSIS — M546 Pain in thoracic spine: Secondary | ICD-10-CM | POA: Diagnosis not present

## 2019-10-31 DIAGNOSIS — Z87441 Personal history of nephrotic syndrome: Secondary | ICD-10-CM | POA: Diagnosis not present

## 2019-10-31 DIAGNOSIS — Z7289 Other problems related to lifestyle: Secondary | ICD-10-CM | POA: Diagnosis not present

## 2019-10-31 DIAGNOSIS — N760 Acute vaginitis: Secondary | ICD-10-CM | POA: Diagnosis not present

## 2019-10-31 DIAGNOSIS — G8929 Other chronic pain: Secondary | ICD-10-CM | POA: Diagnosis not present

## 2019-10-31 DIAGNOSIS — B9689 Other specified bacterial agents as the cause of diseases classified elsewhere: Secondary | ICD-10-CM | POA: Diagnosis not present

## 2019-10-31 DIAGNOSIS — Z23 Encounter for immunization: Secondary | ICD-10-CM | POA: Diagnosis not present

## 2019-10-31 DIAGNOSIS — Z113 Encounter for screening for infections with a predominantly sexual mode of transmission: Secondary | ICD-10-CM | POA: Diagnosis not present

## 2019-10-31 DIAGNOSIS — N920 Excessive and frequent menstruation with regular cycle: Secondary | ICD-10-CM | POA: Diagnosis not present

## 2019-10-31 DIAGNOSIS — Z124 Encounter for screening for malignant neoplasm of cervix: Secondary | ICD-10-CM | POA: Diagnosis not present

## 2019-11-05 DIAGNOSIS — R1084 Generalized abdominal pain: Secondary | ICD-10-CM | POA: Diagnosis not present

## 2019-11-05 DIAGNOSIS — R109 Unspecified abdominal pain: Secondary | ICD-10-CM | POA: Diagnosis not present

## 2019-11-05 DIAGNOSIS — R638 Other symptoms and signs concerning food and fluid intake: Secondary | ICD-10-CM | POA: Diagnosis not present

## 2019-11-29 DIAGNOSIS — R1084 Generalized abdominal pain: Secondary | ICD-10-CM | POA: Diagnosis not present

## 2019-11-29 DIAGNOSIS — R109 Unspecified abdominal pain: Secondary | ICD-10-CM | POA: Diagnosis not present

## 2019-11-29 DIAGNOSIS — R638 Other symptoms and signs concerning food and fluid intake: Secondary | ICD-10-CM | POA: Diagnosis not present

## 2019-12-05 DIAGNOSIS — R1031 Right lower quadrant pain: Secondary | ICD-10-CM | POA: Diagnosis not present

## 2019-12-05 DIAGNOSIS — Z6825 Body mass index (BMI) 25.0-25.9, adult: Secondary | ICD-10-CM | POA: Diagnosis not present

## 2019-12-05 DIAGNOSIS — R1032 Left lower quadrant pain: Secondary | ICD-10-CM | POA: Diagnosis not present

## 2020-04-27 ENCOUNTER — Other Ambulatory Visit: Payer: Self-pay

## 2020-04-27 ENCOUNTER — Encounter: Payer: Self-pay | Admitting: Emergency Medicine

## 2020-04-27 ENCOUNTER — Emergency Department
Admission: EM | Admit: 2020-04-27 | Discharge: 2020-04-27 | Disposition: A | Discharge status: Home / Self Care | Payer: No Typology Code available for payment source | Attending: Emergency Medicine | Admitting: Emergency Medicine

## 2020-04-27 DIAGNOSIS — Z79899 Other long term (current) drug therapy: Secondary | ICD-10-CM | POA: Insufficient documentation

## 2020-04-27 DIAGNOSIS — Z0279 Encounter for issue of other medical certificate: Secondary | ICD-10-CM | POA: Insufficient documentation

## 2020-04-27 DIAGNOSIS — Z041 Encounter for examination and observation following transport accident: Secondary | ICD-10-CM | POA: Diagnosis not present

## 2020-04-27 DIAGNOSIS — Z7689 Persons encountering health services in other specified circumstances: Secondary | ICD-10-CM

## 2020-04-27 NOTE — ED Provider Notes (Signed)
[facility] Emergency Department Provider Note  ____________________________________________   First MD Initiated Contact with Patient 04/27/20 2116     (approximate)  I have reviewed the triage vital signs and the nursing notes.   HISTORY  Chief Complaint Back Pain    HPI Alexandra Haney is a 24 y.o. female presents emergency department complaining of a work injury.  She states that she was not hurt as badly as her supervisor.  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  She states it just aggravated her back a little bit but she does not hurt at this time.  No numbness or tingling.  No head injury.  No headache.  No vomiting.    History reviewed. No pertinent past medical history.  Patient Active Problem List   Diagnosis Date Noted  . Esophagitis, reflux 08/10/2016  . Anxiety disorder 10/15/2008    Past Surgical History:  Procedure Laterality Date  . KIDNEY SURGERY    . KIDNEY SURGERY     reflux    Prior to Admission medications   Medication Sig Start Date End Date Taking? Authorizing Provider  miconazole (MICATIN) 2 % cream Apply 1 application topically 2 (two) times daily. 06/25/19   Trey Sailors, PA-C  triamcinolone cream (KENALOG) 0.1 % Apply 1 application topically 2 (two) times daily. 06/25/19   Trey Sailors, PA-C    Allergies Cephalosporins  Family History  Problem Relation Age of Onset  . Endometriosis Mother   . Fibroids Mother   . Diabetes Mother   . Cancer Maternal Aunt     Social History Social History   Tobacco Use  . Smoking status: Never Smoker  . Smokeless tobacco: Never Used  Substance Use Topics  . Alcohol use: No  . Drug use: No    Review of Systems  Constitutional: No fever/chills Eyes: No visual changes. ENT: No sore throat. Respiratory: Denies cough Cardiovascular: Denies chest pain Gastrointestinal: Denies abdominal pain Genitourinary:  Negative for dysuria. Musculoskeletal: Negative for back pain. Skin: Negative for rash. Psychiatric: no mood changes,     ____________________________________________   PHYSICAL EXAM:  VITAL SIGNS: ED Triage Vitals [04/27/20 2053]  Enc Vitals Group     BP 132/69     Pulse Rate 83     Resp 17     Temp 98.4 F (36.9 C)     Temp Source Oral     SpO2 97 %     Weight      Height      Head Circumference      Peak Flow      Pain Score      Pain Loc      Pain Edu?      Excl. in GC?     Constitutional: Alert and oriented. Well appearing and in no acute distress. Eyes: Conjunctivae are normal.  Head: Atraumatic. Nose: No congestion/rhinnorhea. Mouth/Throat: Mucous membranes are moist.   Neck:  supple no lymphadenopathy noted Cardiovascular: Normal rate, regular rhythm. Respiratory: Normal respiratory effort.  No retractions GU: deferred Musculoskeletal: FROM all extremities, warm and well perfused, patient is able to bend forward hyperextend, walk on her toes, walk on her heels without difficulty, no C-spine or spinal tenderness noted. Neurologic:  Normal speech and language.  Skin:  Skin is warm, dry and intact. No rash noted. Psychiatric: Mood and affect are normal. Speech and behavior are normal.  ____________________________________________   LABS (all  labs ordered are listed, but only abnormal results are displayed)  Labs Reviewed - No data to display ____________________________________________   ____________________________________________  RADIOLOGY    ____________________________________________   PROCEDURES  Procedure(s) performed: No  Procedures    ____________________________________________   INITIAL IMPRESSION / ASSESSMENT AND PLAN / ED COURSE  Pertinent labs & imaging results that were available during my care of the patient were reviewed by me and considered in my medical decision making (see chart for details).   Patient is  24 year old female presents emergency department due to [employer] evaluation.  See HPI  Physical exam shows patient to appear well.  No injuries are noted  I did explain findings to the patient.  She is to take over-the-counter Tylenol or ibuprofen if needed.  She may return to work without restrictions.  She is discharged stable condition.    Alexandra Haney was evaluated in Emergency Department on 04/27/2020 for the symptoms described in the history of present illness. She was evaluated in the context of the global COVID-19 pandemic, which necessitated consideration that the patient might be at risk for infection with the SARS-CoV-2 virus that causes COVID-19. Institutional protocols and algorithms that pertain to the evaluation of patients at risk for COVID-19 are in a state of rapid change based on information released by regulatory bodies including the CDC and federal and state organizations. These policies and algorithms were followed during the patient's care in the ED.   As part of my medical decision making, I reviewed the following data within the [place] notes reviewed and incorporated, Old chart reviewed, Notes from prior ED visits and [place] Controlled Substance Database  ____________________________________________   FINAL CLINICAL IMPRESSION(S) / ED DIAGNOSES  Final diagnoses:  Return to work exam      NEW MEDICATIONS STARTED DURING THIS VISIT:  New Prescriptions   No medications on file     Note:  This document was prepared using Dragon voice recognition software and may include unintentional dictation errors.    Versie Starks, PA-C 04/27/20 2139    Delman Kitten, MD 04/28/20 [phone]

## 2020-04-27 NOTE — ED Notes (Signed)
Pt employed with [employer], here for injury at work PTA; employee not listed in our workers comp profile, spoke with Sallee Lange [phone]) who st no drug testing required for works comp

## 2020-04-27 NOTE — Discharge Instructions (Signed)
You may return to work without any restrictions.  Do not see any injuries that need to be addressed at this time.

## 2020-04-27 NOTE — ED Triage Notes (Signed)
Pt at work when a metal frame fell onto her back. Pt brought to ED for further evaluation. Pt denies pain/injury at this time.

## 2020-04-27 NOTE — ED Notes (Signed)
See triage note, pt reports box fell on her at work today. Denies hitting head. Reports hurting back "a long time ago at the gym", back feels "aggravated" after today.  States work made her come get checked out.  Denies pain other than chronic back pain.

## 2020-11-03 ENCOUNTER — Other Ambulatory Visit: Payer: Self-pay

## 2020-11-03 ENCOUNTER — Ambulatory Visit (INDEPENDENT_AMBULATORY_CARE_PROVIDER_SITE_OTHER): Payer: BC Managed Care – PPO | Admitting: Adult Health

## 2020-11-03 ENCOUNTER — Encounter: Payer: Self-pay | Admitting: Adult Health

## 2020-11-03 VITALS — BP 101/70 | HR 99 | Temp 98.2°F | Resp 15 | Ht 63.0 in | Wt 166.2 lb

## 2020-11-03 DIAGNOSIS — Z3201 Encounter for pregnancy test, result positive: Secondary | ICD-10-CM | POA: Diagnosis not present

## 2020-11-03 DIAGNOSIS — N912 Amenorrhea, unspecified: Secondary | ICD-10-CM | POA: Diagnosis not present

## 2020-11-03 DIAGNOSIS — Z6829 Body mass index (BMI) 29.0-29.9, adult: Secondary | ICD-10-CM

## 2020-11-03 DIAGNOSIS — Z3A01 Less than 8 weeks gestation of pregnancy: Secondary | ICD-10-CM

## 2020-11-03 LAB — POCT URINE PREGNANCY: Preg Test, Ur: POSITIVE — AB

## 2020-11-03 NOTE — Progress Notes (Signed)
Established patient visit   Patient: Alexandra Haney   DOB: 05/02/1996   24 y.o. Female  MRN: [id number] Visit Date: 11/03/2020  Today's healthcare provider: Jairo BenMichelle Smith Greyson Riccardi, FNP   Chief Complaint  Patient presents with  . Possible Pregnancy   Subjective    HPI  Patient presents in office today to confirm pregnancy after a home pregnancy test came back positive. She is healthy otherwise she reports, denies any symptoms.  She has had some cramping around 10/22/20 that feels like she is going to start her cycle. No bleeding at all. No pin point pain in abdomen reports is generalized to lower abdomen.  Gestational Age 376 weeks & 3 days Due Date Saturday, Jun 26, 2021 Date of Conception Saturday, Oct 03, 2020 Not taking prenatals.   [id number] She plans to go through with this pregnancy she reports. She has supportive partner.   [facility]. History of bad cramping in past and endometriosis.  She reports it was normal ultrasound and PAP in past one year.    Patient  denies any fever, body aches,chills, rash, chest pain, shortness of breath, nausea, vomiting, or diarrhea.   Denies dizziness, lightheadedness, pre syncopal or syncopal episodes.     BP 101/70   Pulse 99   Temp 98.2 F (36.8 C) (Oral)   Resp 15   Ht 5\' 3"  (1.6 m)   Wt 166 lb 3.2 oz (75.4 kg)   LMP 09/19/2020 (Exact Date)    Patient Active Problem List   Diagnosis Date Noted  . Positive pregnancy test 11/04/2020  . Body mass index 29.0-29.9, adult 11/04/2020  . Less than [redacted] weeks gestation of pregnancy 11/04/2020  . Absence of menstruation 11/04/2020  . Esophagitis, reflux 08/10/2016  . Anxiety disorder 10/15/2008   History reviewed. No pertinent past medical history. Family History  Problem Relation Age of Onset  . Endometriosis Mother   . Fibroids Mother   . Diabetes Mother   . Cancer Maternal Aunt    Allergies  Allergen Reactions  . Nickel Hives, Rash and  Swelling  . Penicillin G Hives, Itching, Rash and Swelling  . Cephalosporins Hives and Itching  . Other Itching and Other (See Comments)       Medications: Outpatient Medications Prior to Visit  Medication Sig  . miconazole (MICATIN) 2 % cream Apply 1 application topically 2 (two) times daily. (Patient not taking: Reported on 11/03/2020)  . triamcinolone cream (KENALOG) 0.1 % Apply 1 application topically 2 (two) times daily. (Patient not taking: Reported on 11/03/2020)   No facility-administered medications prior to visit.    Review of Systems  Constitutional: Negative.   HENT: Negative.   Respiratory: Negative.   Cardiovascular: Negative.   Gastrointestinal: Negative.   Genitourinary: Negative.  Negative for decreased urine volume, hematuria, menstrual problem, urgency, vaginal bleeding, vaginal discharge and vaginal pain.  Musculoskeletal: Negative.   Hematological: Negative.   Psychiatric/Behavioral: Negative.       Objective    BP 101/70   Pulse 99   Temp 98.2 F (36.8 C) (Oral)   Resp 15   Ht 5\' 3"  (1.6 m)   Wt 166 lb 3.2 oz (75.4 kg)   LMP 09/19/2020 (Exact Date)   SpO2 100%   BMI 29.44 kg/m    Physical Exam Vitals reviewed.  Constitutional:      General: She is not in acute distress.    Appearance: She is well-developed. She is not  diaphoretic.     Interventions: She is not intubated. HENT:     Head: Normocephalic and atraumatic.     Right Ear: Tympanic membrane, ear canal and external ear normal. There is no impacted cerumen.     Left Ear: Tympanic membrane, ear canal and external ear normal. There is no impacted cerumen.     Nose: Nose normal. No congestion or rhinorrhea.     Mouth/Throat:     Mouth: Mucous membranes are moist.     Pharynx: No oropharyngeal exudate or posterior oropharyngeal erythema.  Eyes:     General: Lids are normal. No scleral icterus.       Right eye: No discharge.        Left eye: No discharge.     Extraocular Movements:  Extraocular movements intact.     Conjunctiva/sclera: Conjunctivae normal.     Right eye: Right conjunctiva is not injected. No exudate or hemorrhage.    Left eye: Left conjunctiva is not injected. No exudate or hemorrhage.    Pupils: Pupils are equal, round, and reactive to light.  Neck:     Thyroid: No thyroid mass or thyromegaly.     Vascular: Normal carotid pulses. No carotid bruit, hepatojugular reflux or JVD.     Trachea: Trachea and phonation normal. No tracheal tenderness or tracheal deviation.     Meningeal: Brudzinski's sign and Kernig's sign absent.  Cardiovascular:     Rate and Rhythm: Normal rate and regular rhythm.     Pulses: Normal pulses.          Radial pulses are 2+ on the right side and 2+ on the left side.       Dorsalis pedis pulses are 2+ on the right side and 2+ on the left side.       Posterior tibial pulses are 2+ on the right side and 2+ on the left side.     Heart sounds: Normal heart sounds, S1 normal and S2 normal. Heart sounds not distant. No murmur heard.  No friction rub. No gallop.   Pulmonary:     Effort: Pulmonary effort is normal. No tachypnea, bradypnea, accessory muscle usage or respiratory distress. She is not intubated.     Breath sounds: Normal breath sounds. No wheezing, rhonchi or rales.  Chest:     Chest wall: No tenderness.  Abdominal:     General: Bowel sounds are normal. There is no distension or abdominal bruit.     Palpations: Abdomen is soft. There is no shifting dullness, fluid wave, hepatomegaly, splenomegaly, mass or pulsatile mass.     Tenderness: There is no abdominal tenderness. There is no right CVA tenderness, left CVA tenderness, guarding or rebound.     Hernia: No hernia is present.  Musculoskeletal:        General: No tenderness or deformity. Normal range of motion.     Cervical back: Full passive range of motion without pain, normal range of motion and neck supple. No edema, erythema or rigidity. No spinous process  tenderness or muscular tenderness. Normal range of motion.  Lymphadenopathy:     Head:     Right side of head: No submental, submandibular, tonsillar, preauricular, posterior auricular or occipital adenopathy.     Left side of head: No submental, submandibular, tonsillar, preauricular, posterior auricular or occipital adenopathy.     Cervical: No cervical adenopathy.     Right cervical: No superficial, deep or posterior cervical adenopathy.    Left cervical: No superficial, deep or posterior  cervical adenopathy.     Upper Body:     Right upper body: No supraclavicular or pectoral adenopathy.     Left upper body: No supraclavicular or pectoral adenopathy.  Skin:    General: Skin is warm and dry.     Coloration: Skin is not pale.     Findings: No abrasion, bruising, burn, ecchymosis, erythema, lesion, petechiae or rash.     Nails: There is no clubbing.  Neurological:     General: No focal deficit present.     Mental Status: She is alert and oriented to person, place, and time.     GCS: GCS eye subscore is 4. GCS verbal subscore is 5. GCS motor subscore is 6.     Cranial Nerves: No cranial nerve deficit.     Sensory: No sensory deficit.     Motor: No weakness, tremor, atrophy, abnormal muscle tone or seizure activity.     Coordination: Coordination normal.     Gait: Gait normal.     Deep Tendon Reflexes: Reflexes are normal and symmetric. Reflexes normal. Babinski sign absent on the right side. Babinski sign absent on the left side.     Reflex Scores:      Tricep reflexes are 2+ on the right side and 2+ on the left side.      Bicep reflexes are 2+ on the right side and 2+ on the left side.      Brachioradialis reflexes are 2+ on the right side and 2+ on the left side.      Patellar reflexes are 2+ on the right side and 2+ on the left side.      Achilles reflexes are 2+ on the right side and 2+ on the left side. Psychiatric:        Mood and Affect: Mood normal.        Speech: Speech  normal.        Behavior: Behavior normal.        Thought Content: Thought content normal.        Judgment: Judgment normal.      Results for orders placed or performed in visit on 11/03/20  CBC with Differential/Platelet  Result Value Ref Range   WBC 13.4 (H) 3.4 - 10.8 x10E3/uL   RBC 4.58 3.77 - 5.28 x10E6/uL   Hemoglobin 13.4 11.1 - 15.9 g/dL   Hematocrit 27.0 35.0 - 46.6 %   MCV 88 79 - 97 fL   MCH 29.3 26.6 - 33.0 pg   MCHC 33.4 31 - 35 g/dL   RDW 09.3 81.8 - 29.9 %   Platelets 234 150 - 450 x10E3/uL   Neutrophils 63 Not Estab. %   Lymphs 30 Not Estab. %   Monocytes 6 Not Estab. %   Eos 1 Not Estab. %   Basos 0 Not Estab. %   Neutrophils Absolute 8.4 (H) 1.40 - 7.00 x10E3/uL   Lymphocytes Absolute 4.0 (H) 0 - 3 x10E3/uL   Monocytes Absolute 0.8 0 - 0 x10E3/uL   EOS (ABSOLUTE) 0.1 0.0 - 0.4 x10E3/uL   Basophils Absolute 0.1 0 - 0 x10E3/uL   Immature Granulocytes 0 Not Estab. %   Immature Grans (Abs) 0.0 0.0 - 0.1 x10E3/uL  Comprehensive Metabolic Panel (CMET)  Result Value Ref Range   Glucose 75 65 - 99 mg/dL   BUN 13 6 - 20 mg/dL   Creatinine, Ser 3.71 0.57 - 1.00 mg/dL   GFR calc non Af Amer 119 >59 mL/min/1.73   GFR  calc Af Amer 137 >59 mL/min/1.73   BUN/Creatinine Ratio 18 9 - 23   Sodium 138 134 - 144 mmol/L   Potassium 4.0 3.5 - 5.2 mmol/L   Chloride 102 96 - 106 mmol/L   CO2 21 20 - 29 mmol/L   Calcium 9.9 8.7 - 10.2 mg/dL   Total Protein 7.3 6.0 - 8.5 g/dL   Albumin 4.7 3.9 - 5.0 g/dL   Globulin, Total 2.6 1.5 - 4.5 g/dL   Albumin/Globulin Ratio 1.8 1.2 - 2.2   Bilirubin Total 0.8 0.0 - 1.2 mg/dL   Alkaline Phosphatase 57 44 - 121 IU/L   AST 24 0 - 40 IU/L   ALT 26 0 - 32 IU/L  Lipid Profile  Result Value Ref Range   Cholesterol, Total 191 100 - 199 mg/dL   Triglycerides 49 0 - 149 mg/dL   HDL 64 >37 mg/dL   VLDL Cholesterol Cal 9 5 - 40 mg/dL   LDL Chol Calc (NIH) 628 (H) 0 - 99 mg/dL   Chol/HDL Ratio 3.0 0.0 - 4.4 ratio  RPR  Result Value Ref  Range   RPR Ser Ql Non Reactive Non Reactive  HIV antibody (with reflex)  Result Value Ref Range   HIV Screen 4th Generation wRfx Non Reactive Non Reactive  POCT urine pregnancy  Result Value Ref Range   Preg Test, Ur Positive (A) Negative    Assessment & Plan     *Absence of menstruation - Plan: POCT urine pregnancy, Ambulatory referral to Obstetrics / Gynecology  Less than [redacted] weeks gestation of pregnancy - Plan: CBC with Differential/Platelet, Comprehensive Metabolic Panel (CMET), TSH, Lipid Profile, RPR, HIV antibody (with reflex), [place] OP OB Comp Less 14 Wks, Ambulatory referral to Obstetrics / Gynecology  Body mass index 29.0-29.9, adult  Positive pregnancy test  No orders of the defined types were placed in this encounter.  Prenatal with folic acid advised daily.   Orders Placed This Encounter  Procedures  . [place] OP OB Comp Less 14 Wks  . CBC with Differential/Platelet  . Comprehensive Metabolic Panel (CMET)  . TSH  . Lipid Profile  . RPR  . HIV antibody (with reflex)  . Ambulatory referral to Obstetrics / Gynecology  . POCT urine pregnancy  call if not heard from ultrasound in one week, ER if any symptoms worsen or change at anytime.   Red Flags discussed. The patient was given clear instructions to go to ER or return to medical center if any red flags develop, symptoms do not improve, worsen or new problems develop. They verbalized understanding.   Return in about 3 months (around 02/03/2021), or if symptoms worsen or fail to improve, for at any time for any worsening symptoms, Go to Emergency room/ urgent care if worse.     Addressed acute and or chronic medical problems today requiring 45  minutes reviewing patients medical record,labs, counseling patient regarding patient's conditions, any medications, answering questions regarding health, and coordination of care as needed. After visit summary patient given copy and reviewed.   Jairo Ben, FNP    [facility] [phone] (phone) [phone] (fax)  [facility]

## 2020-11-03 NOTE — Patient Instructions (Addendum)
You should hear from OBGYN within 2 weeks, and I have ordered an ultrasound, you should get a call within one week, if any severe pain please go to the Emergency Room.   You should start a daily prenatal with Folic acid in it.     Preventing Birth Defects with Folic Acid What is folic acid? Folic acid is a vitamin that is used by the body to create new cells and keep the blood healthy. Everyone needs folic acid to stay healthy. It is especially important if you are pregnant. Folic acid is artificial (synthetic). The vitamin in its natural form is called folate. Some foods are natural sources of folate, and other foods have folic acid added to them (fortified foods). You can also buy folic acid supplements or vitamins that contain folic acid. Why is folic acid important during pregnancy? Folic acid helps reduce your baby's risk of serious birth defects, especially:  Spina bifida. Spina bifida occurs when a baby's spinal column does not develop completely. This can cause serious, long-term (chronic) disabilities.  Anencephaly. This is a birth defect that causes your baby to be missing parts of the brain, scalp, and skull when he or she is born. Most babies born with anencephaly only live for a short amount of time. Spina bifida and anencephaly are commonly associated with a lack of folic acid during pregnancy. How much folic acid do I need? If you are pregnant or planning to become pregnant, your health care provider may prescribe vitamins that contain the right amount of folic acid that you need.  If you plan to become pregnant, you should take at least 400 mcg (micrograms) of folic acid daily. Birth defects usually occur in the earliest stages of pregnancy, often before you know you are pregnant. Taking folic acid when you start trying to become pregnant can help prevent birth defects.  While you are pregnant, you need at least 600 mcg of folic acid each day. What nutrition changes can be  made? To increase your intake of folate and folic acid, you may:  Eat more foods that are fortified with folic acid. Check food labels to see whether a food contains folic acid. Foods that are commonly fortified with folic acid include: ? Cereals. ? Pastas. ? Flours. ? White rice. ? Breads.  Eat more foods that are natural sources of folate, such as: ? Legumes, including lentils, peas, and beans. ? Nuts. ? Vegetables, especially dark green leafy vegetables, such as spinach and mustard greens. ? Citrus fruits and juices. Your health care provider may still recommend that you take a supplement to make sure that you get enough to reduce the risk of birth defects. Where to find support  Talk with your health care provider or your pharmacist to find a folic acid supplement that is right for you.  Your health care provider may recommend that you see a nutrition specialist (nutritionist). A nutritionist can help you make healthy food choices and get more folate from your diet.  Nutrition education programs may be available through your community health department. Where to find more information Visit the following websites to learn more about the importance of folic acid.  Centers for Disease Control and Prevention: http://steele.com/www.cdc.gov/ncbddd/folicacid/about.html  The Office on Women's Health: iknowitconsult.comwomenshealth.gov/publications/our-publications/fact-sheet/folic-acid.html  [employer] Health: ods.RentRule.siod.nih.gov/factsheets/Folate-Consumer  The March of Dimes: www.marchofdimes.org/pregnancy/folic-acid.aspx Summary  It is important to start taking folic acid when you are planning to become pregnant, because many birth defects can happen before you know that you  are pregnant.  You can get more folate and folic acid from your diet. Look for certain foods that are fortified with folic acid.  Your health care provider may recommend that you take a supplement to get enough folic acid. This  information is not intended to replace advice given to you by your health care provider. Make sure you discuss any questions you have with your health care provider. Document Revised: 11/24/2017 Document Reviewed: 01/13/2016 Elsevier Patient Education  2020 [employer]. Eating Plan for Pregnant Women While you are pregnant, your body requires additional nutrition to help support your growing baby. You also have a higher need for some vitamins and minerals, such as folic acid, calcium, iron, and vitamin D. Eating a healthy, well-balanced diet is very important for your health and your baby's health. Your need for extra calories varies for the three 81-month segments of your pregnancy (trimesters). For most women, it is recommended to consume:  150 extra calories a day during the first trimester.  300 extra calories a day during the second trimester.  300 extra calories a day during the third trimester. What are tips for following this plan?   Do not try to lose weight or go on a diet during pregnancy.  Limit your overall intake of foods that have "empty calories." These are foods that have little nutritional value, such as sweets, desserts, candies, and sugar-sweetened beverages.  Eat a variety of foods (especially fruits and vegetables) to get a full range of vitamins and minerals.  Take a prenatal vitamin to help meet your additional vitamin and mineral needs during pregnancy, specifically for folic acid, iron, calcium, and vitamin D.  Remember to stay active. Ask your health care provider what types of exercise and activities are safe for you.  Practice good food safety and cleanliness. Wash your hands before you eat and after you prepare raw meat. Wash all fruits and vegetables well before peeling or eating. Taking these actions can help to prevent food-borne illnesses that can be very dangerous to your baby, such as listeriosis. Ask your health care provider for more information about  listeriosis. What does 150 extra calories look like? Healthy options that provide 150 extra calories each day could be any of the following:  6-8 oz (170-230 g) of plain low-fat yogurt with  cup of berries.  1 apple with 2 teaspoons (11 g) of peanut butter.  Cut-up vegetables with  cup (60 g) of hummus.  8 oz (230 mL) or 1 cup of low-fat chocolate milk.  1 stick of string cheese with 1 medium orange.  1 peanut butter and jelly sandwich that is made with one slice of whole-wheat bread and 1 tsp (5 g) of peanut butter. For 300 extra calories, you could eat two of those healthy options each day. What is a healthy amount of weight to gain? The right amount of weight gain for you is based on your BMI before you became pregnant. If your BMI:  Was less than 18 (underweight), you should gain 28-40 lb (13-18 kg).  Was 18-24.9 (normal), you should gain 25-35 lb (11-16 kg).  Was 25-29.9 (overweight), you should gain 15-25 lb (7-11 kg).  Was 30 or greater (obese), you should gain 11-20 lb (5-9 kg). What if I am having twins or multiples? Generally, if you are carrying twins or multiples:  You may need to eat 300-600 extra calories a day.  The recommended range for total weight gain is 25-54 lb (11-25 kg), depending  on your BMI before pregnancy.  Talk with your health care provider to find out about nutritional needs, weight gain, and exercise that is right for you. What foods can I eat?  Fruits All fruits. Eat a variety of colors and types of fruit. Remember to wash your fruits well before peeling or eating. Vegetables All vegetables. Eat a variety of colors and types of vegetables. Remember to wash your vegetables well before peeling or eating. Grains All grains. Choose whole grains, such as whole-wheat bread, oatmeal, or brown rice. Meats and other protein foods Lean meats, including chicken, [place], fish, and lean cuts of beef, veal, or pork. If you eat fish or seafood, choose  options that are higher in omega-3 fatty acids and lower in mercury, such as salmon, herring, mussels, trout, sardines, pollock, shrimp, crab, and lobster. Tofu. Tempeh. Beans. Eggs. Peanut butter and other nut butters. Make sure that all meats, poultry, and eggs are cooked to food-safe temperatures or "well-done." Two or more servings of fish are recommended each week in order to get the most benefits from omega-3 fatty acids that are found in seafood. Choose fish that are lower in mercury. You can find more information online:  PumpkinSearch.com.ee Dairy Pasteurized milk and milk alternatives (such as almond milk). Pasteurized yogurt and pasteurized cheese. Cottage cheese. Sour cream. Beverages Water. Juices that contain 100% fruit juice or vegetable juice. Caffeine-free teas and decaffeinated coffee. Drinks that contain caffeine are okay to drink, but it is better to avoid caffeine. Keep your total caffeine intake to less than 200 mg each day (which is 12 oz or 355 mL of coffee, tea, or soda) or the limit as told by your health care provider. Fats and oils Fats and oils are okay to include in moderation. Sweets and desserts Sweets and desserts are okay to include in moderation. Seasoning and other foods All pasteurized condiments. The items listed above may not be a complete list of foods and beverages you can eat. Contact a dietitian for more information. What foods are not recommended? Fruits Unpasteurized fruit juices. Vegetables Raw (unpasteurized) vegetable juices. Meats and other protein foods Lunch meats, bologna, hot dogs, or other deli meats. (If you must eat those meats, reheat them until they are steaming hot.) Refrigerated pat, meat spreads from a meat counter, smoked seafood that is found in the refrigerated section of a store. Raw or undercooked meats, poultry, and eggs. Raw fish, such as sushi or sashimi. Fish that have high mercury content, such as tilefish, shark, swordfish, and  king mackerel. To learn more about mercury in fish, talk with your health care provider or look for online resources, such as:  PumpkinSearch.com.ee Dairy Raw (unpasteurized) milk and any foods that have raw milk in them. Soft cheeses, such as feta, queso blanco, queso fresco, Brie, Camembert cheeses, blue-veined cheeses, and Panela cheese (unless it is made with pasteurized milk, which must be stated on the label). Beverages Alcohol. Sugar-sweetened beverages, such as sodas, teas, or energy drinks. Seasoning and other foods Homemade fermented foods and drinks, such as pickles, sauerkraut, or kombucha drinks. (Store-bought pasteurized versions of these are okay.) Salads that are made in a store or deli, such as ham salad, chicken salad, egg salad, tuna salad, and seafood salad. The items listed above may not be a complete list of foods and beverages you should avoid. Contact a dietitian for more information. Where to find more information To calculate the number of calories you need based on your height, weight, and  activity level, you can use an online calculator such as:  PackageNews.is To calculate how much weight you should gain during pregnancy, you can use an online pregnancy weight gain calculator such as:  http://jones-berg.com/ Summary  While you are pregnant, your body requires additional nutrition to help support your growing baby.  Eat a variety of foods, especially fruits and vegetables to get a full range of vitamins and minerals.  Practice good food safety and cleanliness. Wash your hands before you eat and after you prepare raw meat. Wash all fruits and vegetables well before peeling or eating. Taking these actions can help to prevent food-borne illnesses, such as listeriosis, that can be very dangerous to your baby.  Do not eat raw meat or fish. Do not eat fish that have high mercury content, such as tilefish, shark, swordfish, and  king mackerel. Do not eat unpasteurized (raw) dairy.  Take a prenatal vitamin to help meet your additional vitamin and mineral needs during pregnancy, specifically for folic acid, iron, calcium, and vitamin D. This information is not intended to replace advice given to you by your health care provider. Make sure you discuss any questions you have with your health care provider. Document Revised: 05/02/2019 Document Reviewed: 09/08/2017 Elsevier Patient Education  2020 Elsevier Inc.   Calorie Counting for [employer] Calories are units of energy. Your body needs a certain amount of calories from food to keep you going throughout the day. When you eat more calories than your body needs, your body stores the extra calories as fat. When you eat fewer calories than your body needs, your body burns fat to get the energy it needs. Calorie counting means keeping track of how many calories you eat and drink each day. Calorie counting can be helpful if you need to lose weight. If you make sure to eat fewer calories than your body needs, you should lose weight. Ask your health care provider what a healthy weight is for you. For calorie counting to work, you will need to eat the right number of calories in a day in order to lose a healthy amount of weight per week. A dietitian can help you determine how many calories you need in a day and will give you suggestions on how to reach your calorie goal.  A healthy amount of weight to lose per week is usually 1-2 lb (0.5-0.9 kg). This usually means that your daily calorie intake should be reduced by 500-750 calories.  Eating 1,200 - 1,500 calories per day can help most women lose weight.  Eating 1,500 - 1,800 calories per day can help most men lose weight. What is my plan? My goal is to have __________ calories per day. If I have this many calories per day, I should lose around __________ pounds per week. What do I need to know about calorie counting? In order  to meet your daily calorie goal, you will need to:  Find out how many calories are in each food you would like to eat. Try to do this before you eat.  Decide how much of the food you plan to eat.  Write down what you ate and how many calories it had. Doing this is called keeping a food log. To successfully lose weight, it is important to balance calorie counting with a healthy lifestyle that includes regular activity. Aim for 150 minutes of moderate exercise (such as walking) or 75 minutes of vigorous exercise (such as running) each week. Where do I find calorie  information?  The number of calories in a food can be found on a Nutrition Facts label. If a food does not have a Nutrition Facts label, try to look up the calories online or ask your dietitian for help. Remember that calories are listed per serving. If you choose to have more than one serving of a food, you will have to multiply the calories per serving by the amount of servings you plan to eat. For example, the label on a package of bread might say that a serving size is 1 slice and that there are 90 calories in a serving. If you eat 1 slice, you will have eaten 90 calories. If you eat 2 slices, you will have eaten 180 calories. How do I keep a food log? Immediately after each meal, record the following information in your food log:  What you ate. Don't forget to include toppings, sauces, and other extras on the food.  How much you ate. This can be measured in cups, ounces, or number of items.  How many calories each food and drink had.  The total number of calories in the meal. Keep your food log near you, such as in a small notebook in your pocket, or use a mobile app or website. Some programs will calculate calories for you and show you how many calories you have left for the day to meet your goal. What are some calorie counting tips?   Use your calories on foods and drinks that will fill you up and not leave you  hungry: ? Some examples of foods that fill you up are nuts and nut butters, vegetables, lean proteins, and high-fiber foods like whole grains. High-fiber foods are foods with more than 5 g fiber per serving. ? Drinks such as sodas, specialty coffee drinks, alcohol, and juices have a lot of calories, yet do not fill you up.  Eat nutritious foods and avoid empty calories. Empty calories are calories you get from foods or beverages that do not have many vitamins or protein, such as candy, sweets, and soda. It is better to have a nutritious high-calorie food (such as an avocado) than a food with few nutrients (such as a bag of chips).  Know how many calories are in the foods you eat most often. This will help you calculate calorie counts faster.  Pay attention to calories in drinks. Low-calorie drinks include water and unsweetened drinks.  Pay attention to nutrition labels for "low fat" or "fat free" foods. These foods sometimes have the same amount of calories or more calories than the full fat versions. They also often have added sugar, starch, or salt, to make up for flavor that was removed with the fat.  Find a way of tracking calories that works for you. Get creative. Try different apps or programs if writing down calories does not work for you. What are some portion control tips?  Know how many calories are in a serving. This will help you know how many servings of a certain food you can have.  Use a measuring cup to measure serving sizes. You could also try weighing out portions on a kitchen scale. With time, you will be able to estimate serving sizes for some foods.  Take some time to put servings of different foods on your favorite plates, bowls, and cups so you know what a serving looks like.  Try not to eat straight from a bag or box. Doing this can lead to overeating. Put the amount  you would like to eat in a cup or on a plate to make sure you are eating the right portion.  Use smaller  plates, glasses, and bowls to prevent overeating.  Try not to multitask (for example, watch TV or use your computer) while eating. If it is time to eat, sit down at a table and enjoy your food. This will help you to know when you are full. It will also help you to be aware of what you are eating and how much you are eating. What are tips for following this plan? Reading food labels  Check the calorie count compared to the serving size. The serving size may be smaller than what you are used to eating.  Check the source of the calories. Make sure the food you are eating is high in vitamins and protein and low in saturated and trans fats. Shopping  Read nutrition labels while you shop. This will help you make healthy decisions before you decide to purchase your food.  Make a grocery list and stick to it. Cooking  Try to cook your favorite foods in a healthier way. For example, try baking instead of frying.  Use low-fat dairy products. Meal planning  Use more fruits and vegetables. Half of your plate should be fruits and vegetables.  Include lean proteins like poultry and fish. How do I count calories when eating out?  Ask for smaller portion sizes.  Consider sharing an entree and sides instead of getting your own entree.  If you get your own entree, eat only half. Ask for a box at the beginning of your meal and put the rest of your entree in it so you are not tempted to eat it.  If calories are listed on the menu, choose the lower calorie options.  Choose dishes that include vegetables, fruits, whole grains, low-fat dairy products, and lean protein.  Choose items that are boiled, broiled, grilled, or steamed. Stay away from items that are buttered, battered, fried, or served with cream sauce. Items labeled "crispy" are usually fried, unless stated otherwise.  Choose water, low-fat milk, unsweetened iced tea, or other drinks without added sugar. If you want an alcoholic beverage,  choose a lower calorie option such as a glass of wine or light beer.  Ask for dressings, sauces, and syrups on the side. These are usually high in calories, so you should limit the amount you eat.  If you want a salad, choose a garden salad and ask for grilled meats. Avoid extra toppings like bacon, cheese, or fried items. Ask for the dressing on the side, or ask for olive oil and vinegar or lemon to use as dressing.  Estimate how many servings of a food you are given. For example, a serving of cooked rice is  cup or about the size of half a baseball. Knowing serving sizes will help you be aware of how much food you are eating at restaurants. The list below tells you how big or small some common portion sizes are based on everyday objects: ? 1 oz--4 stacked dice. ? 3 oz--1 deck of cards. ? 1 tsp--1 die. ? 1 Tbsp-- a ping-pong ball. ? 2 Tbsp--1 ping-pong ball. ?  cup-- baseball. ? 1 cup--1 baseball. Summary  Calorie counting means keeping track of how many calories you eat and drink each day. If you eat fewer calories than your body needs, you should lose weight.  A healthy amount of weight to lose per week is usually 1-2  lb (0.5-0.9 kg). This usually means reducing your daily calorie intake by 500-750 calories.  The number of calories in a food can be found on a Nutrition Facts label. If a food does not have a Nutrition Facts label, try to look up the calories online or ask your dietitian for help.  Use your calories on foods and drinks that will fill you up, and not on foods and drinks that will leave you hungry.  Use smaller plates, glasses, and bowls to prevent overeating. This information is not intended to replace advice given to you by your health care provider. Make sure you discuss any questions you have with your health care provider. Document Revised: 08/31/2018 Document Reviewed: 11/11/2016 Elsevier Patient Education  2020 Elsevier Inc.   Fat and Cholesterol Restricted  Eating Plan Getting too much fat and cholesterol in your diet may cause health problems. Choosing the right foods helps keep your fat and cholesterol at normal levels. This can keep you from getting certain diseases. Your doctor may recommend an eating plan that includes:  Total fat: ______% or less of total calories a day.  Saturated fat: ______% or less of total calories a day.  Cholesterol: less than _________mg a day.  Fiber: ______g a day. What are tips for following this plan? Meal planning  At meals, divide your plate into four equal parts: ? Fill one-half of your plate with vegetables and green salads. ? Fill one-fourth of your plate with whole grains. ? Fill one-fourth of your plate with low-fat (lean) protein foods.  Eat fish that is high in omega-3 fats at least two times a week. This includes mackerel, tuna, sardines, and salmon.  Eat foods that are high in fiber, such as whole grains, beans, apples, broccoli, carrots, peas, and barley. General tips   Work with your doctor to lose weight if you need to.  Avoid: ? Foods with added sugar. ? Fried foods. ? Foods with partially hydrogenated oils.  Limit alcohol intake to no more than 1 drink a day for nonpregnant women and 2 drinks a day for men. One drink equals 12 oz of beer, 5 oz of wine, or 1 oz of hard liquor. Reading food labels  Check food labels for: ? Trans fats. ? Partially hydrogenated oils. ? Saturated fat (g) in each serving. ? Cholesterol (mg) in each serving. ? Fiber (g) in each serving.  Choose foods with healthy fats, such as: ? Monounsaturated fats. ? Polyunsaturated fats. ? Omega-3 fats.  Choose grain products that have whole grains. Look for the word "whole" as the first word in the ingredient list. Cooking  Cook foods using low-fat methods. These include baking, boiling, grilling, and broiling.  Eat more home-cooked foods. Eat at restaurants and buffets less often.  Avoid cooking  using saturated fats, such as butter, cream, palm oil, palm kernel oil, and coconut oil. Recommended foods  Fruits  All fresh, canned (in natural juice), or frozen fruits. Vegetables  Fresh or frozen vegetables (raw, steamed, roasted, or grilled). Green salads. Grains  Whole grains, such as whole wheat or whole grain breads, crackers, cereals, and pasta. Unsweetened oatmeal, bulgur, barley, quinoa, or brown rice. Corn or whole wheat flour tortillas. Meats and other protein foods  Ground beef (85% or leaner), grass-fed beef, or beef trimmed of fat. Skinless chicken or [place]. Ground chicken or [place]. Pork trimmed of fat. All fish and seafood. Egg whites. Dried beans, peas, or lentils. Unsalted nuts or seeds. Unsalted canned beans. Nut butters  without added sugar or oil. Dairy  Low-fat or nonfat dairy products, such as skim or 1% milk, 2% or reduced-fat cheeses, low-fat and fat-free ricotta or cottage cheese, or plain low-fat and nonfat yogurt. Fats and oils  Tub margarine without trans fats. Light or reduced-fat mayonnaise and salad dressings. Avocado. Olive, canola, sesame, or safflower oils. The items listed above may not be a complete list of foods and beverages you can eat. Contact a dietitian for more information. Foods to avoid Fruits  Canned fruit in heavy syrup. Fruit in cream or butter sauce. Fried fruit. Vegetables  Vegetables cooked in cheese, cream, or butter sauce. Fried vegetables. Grains  White bread. White pasta. White rice. Cornbread. Bagels, pastries, and croissants. Crackers and snack foods that contain trans fat and hydrogenated oils. Meats and other protein foods  Fatty cuts of meat. Ribs, chicken wings, bacon, sausage, bologna, salami, chitterlings, fatback, hot dogs, bratwurst, and packaged lunch meats. Liver and organ meats. Whole eggs and egg yolks. Chicken and [place] with skin. Fried meat. Dairy  Whole or 2% milk, cream, half-and-half, and cream cheese.  Whole milk cheeses. Whole-fat or sweetened yogurt. Full-fat cheeses. Nondairy creamers and whipped toppings. Processed cheese, cheese spreads, and cheese curds. Beverages  Alcohol. Sugar-sweetened drinks such as sodas, lemonade, and fruit drinks. Fats and oils  Butter, stick margarine, lard, shortening, ghee, or bacon fat. Coconut, palm kernel, and palm oils. Sweets and desserts  Corn syrup, sugars, honey, and molasses. Candy. Jam and jelly. Syrup. Sweetened cereals. Cookies, pies, cakes, donuts, muffins, and ice cream. The items listed above may not be a complete list of foods and beverages you should avoid. Contact a dietitian for more information. Summary  Choosing the right foods helps keep your fat and cholesterol at normal levels. This can keep you from getting certain diseases.  At meals, fill one-half of your plate with vegetables and green salads.  Eat high-fiber foods, like whole grains, beans, apples, carrots, peas, and barley.  Limit added sugar, saturated fats, alcohol, and fried foods. This information is not intended to replace advice given to you by your health care provider. Make sure you discuss any questions you have with your health care provider. Document Revised: 08/15/2018 Document Reviewed: 08/29/2017 Elsevier Patient Education  2020 [employer].

## 2020-11-03 NOTE — Progress Notes (Signed)
Urine pregnancy confirmed, discussed prenatal care, and referred to [facility] care.

## 2020-11-04 DIAGNOSIS — Z3201 Encounter for pregnancy test, result positive: Secondary | ICD-10-CM | POA: Insufficient documentation

## 2020-11-04 DIAGNOSIS — N912 Amenorrhea, unspecified: Secondary | ICD-10-CM | POA: Insufficient documentation

## 2020-11-04 DIAGNOSIS — Z6829 Body mass index (BMI) 29.0-29.9, adult: Secondary | ICD-10-CM | POA: Insufficient documentation

## 2020-11-04 DIAGNOSIS — Z3A01 Less than 8 weeks gestation of pregnancy: Secondary | ICD-10-CM | POA: Insufficient documentation

## 2020-11-04 LAB — CBC WITH DIFFERENTIAL/PLATELET
Basophils Absolute: 0.1 10*3/uL (ref 0.0–0.2)
Basos: 0 %
EOS (ABSOLUTE): 0.1 10*3/uL (ref 0.0–0.4)
Eos: 1 %
Hematocrit: 40.1 % (ref 34.0–46.6)
Hemoglobin: 13.4 g/dL (ref 11.1–15.9)
Immature Grans (Abs): 0 10*3/uL (ref 0.0–0.1)
Immature Granulocytes: 0 %
Lymphocytes Absolute: 4 10*3/uL — ABNORMAL HIGH (ref 0.7–3.1)
Lymphs: 30 %
MCH: 29.3 pg (ref 26.6–33.0)
MCHC: 33.4 g/dL (ref 31.5–35.7)
MCV: 88 fL (ref 79–97)
Monocytes Absolute: 0.8 10*3/uL (ref 0.1–0.9)
Monocytes: 6 %
Neutrophils Absolute: 8.4 10*3/uL — ABNORMAL HIGH (ref 1.4–7.0)
Neutrophils: 63 %
Platelets: 234 10*3/uL (ref 150–450)
RBC: 4.58 x10E6/uL (ref 3.77–5.28)
RDW: 11.8 % (ref 11.7–15.4)
WBC: 13.4 10*3/uL — ABNORMAL HIGH (ref 3.4–10.8)

## 2020-11-04 LAB — HIV ANTIBODY (ROUTINE TESTING W REFLEX): HIV Screen 4th Generation wRfx: NONREACTIVE

## 2020-11-04 LAB — COMPREHENSIVE METABOLIC PANEL
ALT: 26 IU/L (ref 0–32)
AST: 24 IU/L (ref 0–40)
Albumin/Globulin Ratio: 1.8 (ref 1.2–2.2)
Albumin: 4.7 g/dL (ref 3.9–5.0)
Alkaline Phosphatase: 57 IU/L (ref 44–121)
BUN/Creatinine Ratio: 18 (ref 9–23)
BUN: 13 mg/dL (ref 6–20)
Bilirubin Total: 0.8 mg/dL (ref 0.0–1.2)
CO2: 21 mmol/L (ref 20–29)
Calcium: 9.9 mg/dL (ref 8.7–10.2)
Chloride: 102 mmol/L (ref 96–106)
Creatinine, Ser: 0.72 mg/dL (ref 0.57–1.00)
GFR calc Af Amer: 137 mL/min/{1.73_m2} (ref >59)
GFR calc non Af Amer: 119 mL/min/{1.73_m2} (ref >59)
Globulin, Total: 2.6 g/dL (ref 1.5–4.5)
Glucose: 75 mg/dL (ref 65–99)
Potassium: 4 mmol/L (ref 3.5–5.2)
Sodium: 138 mmol/L (ref 134–144)
Total Protein: 7.3 g/dL (ref 6.0–8.5)

## 2020-11-04 LAB — LIPID PANEL
Chol/HDL Ratio: 3 ratio (ref 0.0–4.4)
Cholesterol, Total: 191 mg/dL (ref 100–199)
HDL: 64 mg/dL (ref >39)
LDL Chol Calc (NIH): 118 mg/dL — ABNORMAL HIGH (ref 0–99)
Triglycerides: 49 mg/dL (ref 0–149)
VLDL Cholesterol Cal: 9 mg/dL (ref 5–40)

## 2020-11-04 LAB — RPR: RPR Ser Ql: NONREACTIVE

## 2020-11-04 NOTE — Progress Notes (Signed)
Mild elevation in WBC and neutrophils, she needs urine micro and urine culture. She has a history of cystitis. I do not see POCT urine.  Repeat CBC in 2 weeks TSH shows pending- not sure if it was drawn.   Repeat CBC in 2 weeks.  CMP within normal limits.  LDL elevated.  Discuss lifestyle modification with patient e.g. increase exercise, fiber, fruits, vegetables, lean meat, and omega 3/fish intake and decrease saturated fat.  If patient following strict diet and exercise program already please schedule follow up appointment with primary care physician [facility] for syphilis  within normal limits. HIV negative non reactive.

## 2020-11-05 ENCOUNTER — Ambulatory Visit: Payer: BC Managed Care – PPO | Admitting: Adult Health

## 2020-11-06 ENCOUNTER — Other Ambulatory Visit: Payer: Self-pay | Admitting: Adult Health

## 2020-11-06 DIAGNOSIS — Z3491 Encounter for supervision of normal pregnancy, unspecified, first trimester: Secondary | ICD-10-CM

## 2020-11-06 DIAGNOSIS — Z3A01 Less than 8 weeks gestation of pregnancy: Secondary | ICD-10-CM

## 2020-11-06 DIAGNOSIS — R102 Pelvic and perineal pain: Secondary | ICD-10-CM

## 2020-11-06 DIAGNOSIS — Z8742 Personal history of other diseases of the female genital tract: Secondary | ICD-10-CM

## 2020-11-09 ENCOUNTER — Ambulatory Visit
Admission: RE | Admit: 2020-11-09 | Discharge: 2020-11-09 | Disposition: A | Discharge status: Home / Self Care | Payer: BC Managed Care – PPO | Source: Ambulatory Visit | Attending: Adult Health | Admitting: Adult Health

## 2020-11-09 ENCOUNTER — Other Ambulatory Visit: Payer: Self-pay

## 2020-11-09 DIAGNOSIS — Z8742 Personal history of other diseases of the female genital tract: Secondary | ICD-10-CM

## 2020-11-09 DIAGNOSIS — Z3491 Encounter for supervision of normal pregnancy, unspecified, first trimester: Secondary | ICD-10-CM

## 2020-11-09 DIAGNOSIS — R102 Pelvic and perineal pain: Secondary | ICD-10-CM | POA: Diagnosis not present

## 2020-11-09 DIAGNOSIS — Z3A01 Less than 8 weeks gestation of pregnancy: Secondary | ICD-10-CM | POA: Diagnosis not present

## 2020-11-09 DIAGNOSIS — N809 Endometriosis, unspecified: Secondary | ICD-10-CM | POA: Diagnosis not present

## 2020-11-09 DIAGNOSIS — O99891 Other specified diseases and conditions complicating pregnancy: Secondary | ICD-10-CM | POA: Diagnosis not present

## 2020-11-09 DIAGNOSIS — O26891 Other specified pregnancy related conditions, first trimester: Secondary | ICD-10-CM | POA: Diagnosis not present

## 2020-11-10 NOTE — Progress Notes (Signed)
Single intrauterine gestational sac measuring 5 weeks 5 days by mean sac diameter.  Advise early pregnancy, needs repeat ultrasound in 2 weeks not sooner, she has been referred to [facility] and if seen there before do there.  If not seen by then return for office visit follow up in 2 weeks and we will order until she sees OB.

## 2020-11-11 ENCOUNTER — Telehealth: Payer: Self-pay

## 2020-11-11 NOTE — Telephone Encounter (Signed)
-----   Message from Berniece Pap, FNP sent at 11/10/2020 10:13 AM EST ----- Single intrauterine gestational sac measuring 5 weeks 5 days by mean sac diameter.  Advise early pregnancy, needs repeat ultrasound in 2 weeks not sooner, she has been referred to [facility] and if seen there before do there.  If not seen by then return for office visit follow up in 2 weeks and we will order until she sees OB.

## 2020-11-11 NOTE — Telephone Encounter (Signed)
Pt advised.   Thanks,   -Shiasia Porro  

## 2020-11-23 ENCOUNTER — Telehealth: Payer: Self-pay

## 2020-11-23 DIAGNOSIS — D72829 Elevated white blood cell count, unspecified: Secondary | ICD-10-CM

## 2020-11-23 NOTE — Telephone Encounter (Signed)
Copied from CRM [id number]. Topic: General - Other >> Nov 20, 2020  4:11 PM Gwenlyn Fudge wrote: Reason for CRM: Pt called and is requesting to have ordered placed for her repeat labs. She states that she needs them for her cholesterol and her white blood cell count. Please advise.

## 2020-11-23 NOTE — Telephone Encounter (Signed)
Cholesterol was checked in November, can add however insurance may not cover yet.  CBC ok.

## 2020-11-23 NOTE — Telephone Encounter (Signed)
Phone states that textnow subscriber is not accepting calls. Will place future order in chart for patient to have labs drawn at her convenience, Lipid panel doe not need to be repeated this soon we are checking CBC due to previous elevated WBC. These labs are to be fasting, nothing to eat after midnight. Lab hours are Mon & Wed 8-5:30 and And[facility] Flurry & Friday from 8-5. [facility] triage nurse can advise patient if she returns call. KW

## 2020-11-23 NOTE — Telephone Encounter (Signed)
Patient given information from [place] below, patient verbalized understanding and says she will just repeat the CBC.

## 2020-11-23 NOTE — Telephone Encounter (Signed)
According to last lab note it says repeat CBC in 2 weeks, patient is stating in message she needs her cholesterol checked, okay to add Lipid Panel? KW

## 2020-11-23 NOTE — Telephone Encounter (Signed)
Noted cbc only.

## 2020-11-25 ENCOUNTER — Ambulatory Visit (INDEPENDENT_AMBULATORY_CARE_PROVIDER_SITE_OTHER): Payer: BC Managed Care – PPO | Admitting: Certified Nurse Midwife

## 2020-11-25 ENCOUNTER — Other Ambulatory Visit: Payer: Self-pay | Admitting: *Deleted

## 2020-11-25 ENCOUNTER — Other Ambulatory Visit: Payer: Self-pay

## 2020-11-25 VITALS — BP 102/69 | HR 94 | Ht 63.0 in | Wt 166.0 lb

## 2020-11-25 DIAGNOSIS — Z3A01 Less than 8 weeks gestation of pregnancy: Secondary | ICD-10-CM | POA: Diagnosis not present

## 2020-11-25 DIAGNOSIS — D72829 Elevated white blood cell count, unspecified: Secondary | ICD-10-CM | POA: Diagnosis not present

## 2020-11-25 DIAGNOSIS — Z3A08 8 weeks gestation of pregnancy: Secondary | ICD-10-CM

## 2020-11-25 NOTE — Progress Notes (Signed)
Pt presents for NOB nurse interview visit. Pregnancy confirmation done 11/03/2020 at [facility]. G 3 . P 0 0 2 0   . Pregnancy education material explained and given. 0 cats in the home. NOB labs ordered. HIV labs and Drug screen were explained optional and she did not decline. Drug screen ordered. PNV encouraged. Samples given in office today. Genetic screening options discussed . Genetic testing: Desired- to be ordered at NOB PE.  Pt. To follow up with provider in 4 weeks for NOB physical. GA determined by u/s done at [facility] on 11/09/20. See encounter for further details.  Financial policy reviewed. FMLA paperwork policy reviewed and signed. All questions answered.

## 2020-11-25 NOTE — Patient Instructions (Signed)
WHAT OB PATIENTS CAN EXPECT   Confirmation of pregnancy and ultrasound ordered if medically indicated-[redacted] weeks gestation  New OB (NOB) intake with nurse and New OB (NOB) labs- [redacted] weeks gestation  New OB (NOB) physical examination with provider- 11/[redacted] weeks gestation  Flu vaccine-[redacted] weeks gestation  Anatomy scan-[redacted] weeks gestation  Glucose tolerance test, blood work to test for anemia, T-dap vaccine-[redacted] weeks gestation  Vaginal swabs/cultures-STD/Group B strep-[redacted] weeks gestation  Appointments every 4 weeks until 28 weeks  Every 2 weeks from 28 weeks until 36 weeks  Weekly visits from 36 weeks until delivery  Iron-Rich Diet  Iron is a mineral that helps your body to produce hemoglobin. Hemoglobin is a protein in red blood cells that carries oxygen to your body's tissues. Eating too little iron may cause you to feel weak and tired, and it can increase your risk of infection. Iron is naturally found in many foods, and many foods have iron added to them (iron-fortified foods). You may need to follow an iron-rich diet if you do not have enough iron in your body due to certain medical conditions. The amount of iron that you need each day depends on your age, your sex, and any medical conditions you have. Follow instructions from your health care provider or a diet and nutrition specialist (dietitian) about how much iron you should eat each day. What are tips for following this plan? Reading food labels  Check food labels to see how many milligrams (mg) of iron are in each serving. Cooking  Cook foods in pots and pans that are made from iron.  Take these steps to make it easier for your body to absorb iron from certain foods: ? Soak beans overnight before cooking. ? Soak whole grains overnight and drain them before using. ? Ferment flours before baking, such as by using yeast in bread dough. Meal planning  When you eat foods that contain iron, you should eat them with foods that are  high in vitamin C. These include oranges, peppers, tomatoes, potatoes, and mango. Vitamin C helps your body to absorb iron. General information  Take iron supplements only as told by your health care provider. An overdose of iron can be life-threatening. If you were prescribed iron supplements, take them with orange juice or a vitamin C supplement.  When you eat iron-fortified foods or take an iron supplement, you should also eat foods that naturally contain iron, such as meat, poultry, and fish. Eating naturally iron-rich foods helps your body to absorb the iron that is added to other foods or contained in a supplement.  Certain foods and drinks prevent your body from absorbing iron properly. Avoid eating these foods in the same meal as iron-rich foods or with iron supplements. These foods include: ? Coffee, black tea, and red wine. ? Milk, dairy products, and foods that are high in calcium. ? Beans and soybeans. ? Whole grains. What foods should I eat? Fruits Prunes. Raisins. Eat fruits high in vitamin C, such as oranges, grapefruits, and strawberries, alongside iron-rich foods. Vegetables Spinach (cooked). Green peas. Broccoli. Fermented vegetables. Eat vegetables high in vitamin C, such as leafy greens, potatoes, bell peppers, and tomatoes, alongside iron-rich foods. Grains Iron-fortified breakfast cereal. Iron-fortified whole-wheat bread. Enriched rice. Sprouted grains. Meats and other proteins Beef liver. Oysters. Beef. Shrimp. [place]. Chicken. [place]. Sardines. Chickpeas. Nuts. Tofu. Pumpkin seeds. Beverages Tomato juice. Fresh orange juice. Prune juice. Hibiscus tea. Fortified instant breakfast shakes. Sweets and desserts Blackstrap molasses. Seasonings and condiments Tahini.  Fermented soy sauce. Other foods Wheat germ. The items listed above may not be a complete list of recommended foods and beverages. Contact a dietitian for more information. What foods should I  avoid? Grains Whole grains. Bran cereal. Bran flour. Oats. Meats and other proteins Soybeans. Products made from soy protein. Black beans. Lentils. Mung beans. Split peas. Dairy Milk. Cream. Cheese. Yogurt. Cottage cheese. Beverages Coffee. Black tea. Red wine. Sweets and desserts Cocoa. Chocolate. Ice cream. Other foods Basil. Oregano. Large amounts of parsley. The items listed above may not be a complete list of foods and beverages to avoid. Contact a dietitian for more information. Summary  Iron is a mineral that helps your body to produce hemoglobin. Hemoglobin is a protein in red blood cells that carries oxygen to your body's tissues.  Iron is naturally found in many foods, and many foods have iron added to them (iron-fortified foods).  When you eat foods that contain iron, you should eat them with foods that are high in vitamin C. Vitamin C helps your body to absorb iron.  Certain foods and drinks prevent your body from absorbing iron properly, such as whole grains and dairy products. You should avoid eating these foods in the same meal as iron-rich foods or with iron supplements. This information is not intended to replace advice given to you by your health care provider. Make sure you discuss any questions you have with your health care provider. Document Revised: 11/24/2017 Document Reviewed: 11/07/2017 Elsevier Patient Education  2020 [employer]. How a Baby Grows During Pregnancy  Pregnancy begins when a female's sperm enters a female's egg (fertilization). Fertilization usually happens in one of the tubes (fallopian tubes) that connect the ovaries to the womb (uterus). The fertilized egg moves down the fallopian tube to the uterus. Once it reaches the uterus, it implants into the lining of the uterus and begins to grow. For the first 10 weeks, the fertilized egg is called an embryo. After 10 weeks, it is called a fetus. As the fetus continues to grow, it receives oxygen and  nutrients through tissue (placenta) that grows to support the developing baby. The placenta is the life support system for the baby. It provides oxygen and nutrition and removes waste. Learning as much as you can about your pregnancy and how your baby is developing can help you enjoy the experience. It can also make you aware of when there might be a problem and when to ask questions. How long does a typical pregnancy last? A pregnancy usually lasts 280 days, or about 40 weeks. Pregnancy is divided into three periods of growth, also called trimesters:  First trimester: 0-12 weeks.  Second trimester: 13-27 weeks.  Third trimester: 28-40 weeks. The day when your baby is ready to be born (full term) is your estimated date of delivery. How does my baby develop month by month? First month  The fertilized egg attaches to the inside of the uterus.  Some cells will form the placenta. Others will form the fetus.  The arms, legs, brain, spinal cord, lungs, and heart begin to develop.  At the end of the first month, the heart begins to beat. Second month  The bones, inner ear, eyelids, hands, and feet form.  The genitals develop.  By the end of 8 weeks, all major organs are developing. Third month  All of the internal organs are forming.  Teeth develop below the gums.  Bones and muscles begin to grow. The spine can flex.  The skin is transparent.  Fingernails and toenails begin to form.  Arms and legs continue to grow longer, and hands and feet develop.  The fetus is about 3 inches (7.6 cm) long. Fourth month  The placenta is completely formed.  The external sex organs, neck, outer ear, eyebrows, eyelids, and fingernails are formed.  The fetus can hear, swallow, and move its arms and legs.  The kidneys begin to produce urine.  The skin is covered with a white, waxy coating (vernix) and very fine hair (lanugo). Fifth month  The fetus moves around more and can be felt for  the first time (quickening).  The fetus starts to sleep and wake up and may begin to suck its finger.  The nails grow to the end of the fingers.  The organ in the digestive system that makes bile (gallbladder) functions and helps to digest nutrients.  If your baby is a girl, eggs are present in her ovaries. If your baby is a boy, testicles start to move down into his scrotum. Sixth month  The lungs are formed.  The eyes open. The brain continues to develop.  Your baby has fingerprints and toe prints. Your baby's hair grows thicker.  At the end of the second trimester, the fetus is about 9 inches (22.9 cm) long. Seventh month  The fetus kicks and stretches.  The eyes are developed enough to sense changes in light.  The hands can make a grasping motion.  The fetus responds to sound. Eighth month  All organs and body systems are fully developed and functioning.  Bones harden, and taste buds develop. The fetus may hiccup.  Certain areas of the brain are still developing. The skull remains soft. Ninth month  The fetus gains about  lb (0.23 kg) each week.  The lungs are fully developed.  Patterns of sleep develop.  The fetus's head typically moves into a head-down position (vertex) in the uterus to prepare for birth.  The fetus weighs 6-9 lb (2.72-4.08 kg) and is 19-20 inches (48.26-50.8 cm) long. What can I do to have a healthy pregnancy and help my baby develop? General instructions  Take prenatal vitamins as directed by your health care provider. These include vitamins such as folic acid, iron, calcium, and vitamin D. They are important for healthy development.  Take medicines only as directed by your health care provider. Read labels and ask a pharmacist or your health care provider whether over-the-counter medicines, supplements, and prescription drugs are safe to take during pregnancy.  Keep all follow-up visits as directed by your health care provider. This is  important. Follow-up visits include prenatal care and screening tests. How do I know if my baby is developing well? At each prenatal visit, your health care provider will do several different tests to check on your health and keep track of your baby's development. These include:  Fundal height and position. ? Your health care provider will measure your growing belly from your pubic bone to the top of the uterus using a tape measure. ? Your health care provider will also feel your belly to determine your baby's position.  Heartbeat. ? An ultrasound in the first trimester can confirm pregnancy and show a heartbeat, depending on how far along you are. ? Your health care provider will check your baby's heart rate at every prenatal visit.  Second trimester ultrasound. ? This ultrasound checks your baby's development. It also may show your baby's gender. What should I do if I have  concerns about my baby's development? Always talk with your health care provider about any concerns that you may have about your pregnancy and your baby. Summary  A pregnancy usually lasts 280 days, or about 40 weeks. Pregnancy is divided into three periods of growth, also called trimesters.  Your health care provider will monitor your baby's growth and development throughout your pregnancy.  Follow your health care provider's recommendations about taking prenatal vitamins and medicines during your pregnancy.  Talk with your health care provider if you have any concerns about your pregnancy or your developing baby. This information is not intended to replace advice given to you by your health care provider. Make sure you discuss any questions you have with your health care provider. Document Revised: 04/04/2019 Document Reviewed: 10/25/2017 Elsevier Patient Education  2020 [place] of Pregnancy  The first trimester of pregnancy is from week 1 until the end of week 13 (months 1 through 3).  During this time, your baby will begin to develop inside you. At 6-8 weeks, the eyes and face are formed, and the heartbeat can be seen on ultrasound. At the end of 12 weeks, all the baby's organs are formed. Prenatal care is all the medical care you receive before the birth of your baby. Make sure you get good prenatal care and follow all of your doctor's instructions. Follow these instructions at home: Medicines  Take over-the-counter and prescription medicines only as told by your doctor. Some medicines are safe and some medicines are not safe during pregnancy.  Take a prenatal vitamin that contains at least 600 micrograms (mcg) of folic acid.  If you have trouble pooping (constipation), take medicine that will make your stool soft (stool softener) if your doctor approves. Eating and drinking   Eat regular, healthy meals.  Your doctor will tell you the amount of weight gain that is right for you.  Avoid raw meat and uncooked cheese.  If you feel sick to your stomach (nauseous) or throw up (vomit): ? Eat 4 or 5 small meals a day instead of 3 large meals. ? Try eating a few soda crackers. ? Drink liquids between meals instead of during meals.  To prevent constipation: ? Eat foods that are high in fiber, like fresh fruits and vegetables, whole grains, and beans. ? Drink enough fluids to keep your pee (urine) clear or pale yellow. Activity  Exercise only as told by your doctor. Stop exercising if you have cramps or pain in your lower belly (abdomen) or low back.  Do not exercise if it is too hot, too humid, or if you are in a place of great height (high altitude).  Try to avoid standing for long periods of time. Move your legs often if you must stand in one place for a long time.  Avoid heavy lifting.  Wear low-heeled shoes. Sit and stand up straight.  You can have sex unless your doctor tells you not to. Relieving pain and discomfort  Wear a good support bra if your breasts  are sore.  Take warm water baths (sitz baths) to soothe pain or discomfort caused by hemorrhoids. Use hemorrhoid cream if your doctor says it is okay.  Rest with your legs raised if you have leg cramps or low back pain.  If you have puffy, bulging veins (varicose veins) in your legs: ? Wear support hose or compression stockings as told by your doctor. ? Raise (elevate) your feet for 15 minutes, 3-4 times a day. ?  Limit salt in your food. Prenatal care  Schedule your prenatal visits by the twelfth week of pregnancy.  Write down your questions. Take them to your prenatal visits.  Keep all your prenatal visits as told by your doctor. This is important. Safety  Wear your seat belt at all times when driving.  Make a list of emergency phone numbers. The list should include numbers for family, friends, the hospital, and police and fire departments. General instructions  Ask your doctor for a referral to a local prenatal class. Begin classes no later than at the start of month 6 of your pregnancy.  Ask for help if you need counseling or if you need help with nutrition. Your doctor can give you advice or tell you where to go for help.  Do not use hot tubs, steam rooms, or saunas.  Do not douche or use tampons or scented sanitary pads.  Do not cross your legs for long periods of time.  Avoid all herbs and alcohol. Avoid drugs that are not approved by your doctor.  Do not use any tobacco products, including cigarettes, chewing tobacco, and electronic cigarettes. If you need help quitting, ask your doctor. You may get counseling or other support to help you quit.  Avoid cat litter boxes and soil used by cats. These carry germs that can cause birth defects in the baby and can cause a loss of your baby (miscarriage) or stillbirth.  Visit your dentist. At home, brush your teeth with a soft toothbrush. Be gentle when you floss. Contact a doctor if:  You are dizzy.  You have mild cramps  or pressure in your lower belly.  You have a nagging pain in your belly area.  You continue to feel sick to your stomach, you throw up, or you have watery poop (diarrhea).  You have a bad smelling fluid coming from your vagina.  You have pain when you pee (urinate).  You have increased puffiness (swelling) in your face, hands, legs, or ankles. Get help right away if:  You have a fever.  You are leaking fluid from your vagina.  You have spotting or bleeding from your vagina.  You have very bad belly cramping or pain.  You gain or lose weight rapidly.  You throw up blood. It may look like coffee grounds.  You are around people who have [place] measles, fifth disease, or chickenpox.  You have a very bad headache.  You have shortness of breath.  You have any kind of trauma, such as from a fall or a car accident. Summary  The first trimester of pregnancy is from week 1 until the end of week 13 (months 1 through 3).  To take care of yourself and your unborn baby, you will need to eat healthy meals, take medicines only if your doctor tells you to do so, and do activities that are safe for you and your baby.  Keep all follow-up visits as told by your doctor. This is important as your doctor will have to ensure that your baby is healthy and growing well. This information is not intended to replace advice given to you by your health care provider. Make sure you discuss any questions you have with your health care provider. Document Revised: 04/04/2019 Document Reviewed: 12/20/2016 Elsevier Patient Education  [place]. Common Medications Safe in Pregnancy  Acne:      Constipation:  Benzoyl Peroxide     Colace  Clindamycin      Dulcolax Suppository  Topica Erythromycin     Fibercon  Salicylic Acid      Metamucil         Miralax AVOID:        Senakot   Accutane    Cough:  Retin-A       Cough Drops  Tetracycline      Phenergan w/ Codeine if  Rx  Minocycline      Robitussin (Plain & DM)  Antibiotics:     Crabs/Lice:  Ceclor       RID  Cephalosporins    AVOID:  E-Mycins      Kwell  Keflex  Macrobid/Macrodantin   Diarrhea:  Penicillin      Kao-Pectate  Zithromax      Imodium AD         PUSH FLUIDS AVOID:       Cipro     Fever:  Tetracycline      Tylenol (Regular or Extra  Minocycline       Strength)  Levaquin      Extra Strength-Do not          Exceed 8 tabs/24 hrs Caffeine:        <263m/day (equiv. To 1 cup of coffee or  approx. 3 12 oz sodas)         Gas: Cold/Hayfever:       Gas-X  Benadryl      Mylicon  Claritin       Phazyme  **Claritin-D        Chlor-Trimeton    Headaches:  Dimetapp      ASA-Free Excedrin  Drixoral-Non-Drowsy     Cold Compress  Mucinex (Guaifenasin)     Tylenol (Regular or Extra  Sudafed/Sudafed-12 Hour     Strength)  **Sudafed PE Pseudoephedrine   Tylenol Cold & Sinus     Vicks Vapor Rub  Zyrtec  **AVOID if Problems With Blood Pressure         Heartburn: Avoid lying down for at least 1 hour after meals  Aciphex      Maalox     Rash:  Milk of Magnesia     Benadryl    Mylanta       1% Hydrocortisone Cream  Pepcid  Pepcid Complete   Sleep Aids:  Prevacid      Ambien   Prilosec       Benadryl  Rolaids       Chamomile Tea  Tums (Limit 4/day)     Unisom         Tylenol PM         Warm milk-add vanilla or  Hemorrhoids:       Sugar for taste  Anusol/Anusol H.C.  (RX: Analapram 2.5%)  Sugar Substitutes:  Hydrocortisone OTC     Ok in moderation  Preparation H      Tucks        Vaseline lotion applied to tissue with wiping    Herpes:     Throat:  Acyclovir      Oragel  Famvir  Valtrex     Vaccines:         Flu Shot Leg Cramps:       *Gardasil  Benadryl      Hepatitis A         Hepatitis B Nasal Spray:       Pneumovax  Saline Nasal Spray     Polio Booster         Tetanus Nausea:  Tuberculosis test or PPD  Vitamin B6 25 mg  TID   AVOID:    Dramamine      *Gardasil  Emetrol       Live Poliovirus  Ginger Root 250 mg QID    MMR (measles, mumps &  High Complex Carbs @ Bedtime    rebella)  Sea Bands-Accupressure    Varicella (Chickenpox)  Unisom 1/2 tab TID     *No known complications           If received before Pain:         Known pregnancy;   Darvocet       Resume series after  Lortab        Delivery  Percocet    Yeast:   Tramadol      Femstat  Tylenol 3      Gyne-lotrimin  Ultram       Monistat  Vicodin           MISC:         All Sunscreens           Hair Coloring/highlights          Insect Repellant's          (Including DEET)         Mystic Tans

## 2020-11-26 LAB — URINALYSIS, ROUTINE W REFLEX MICROSCOPIC
Bilirubin, UA: NEGATIVE
Glucose, UA: NEGATIVE
Ketones, UA: NEGATIVE
Leukocytes,UA: NEGATIVE
Nitrite, UA: NEGATIVE
Protein,UA: NEGATIVE
RBC, UA: NEGATIVE
Specific Gravity, UA: 1.016 (ref 1.005–1.030)
Urobilinogen, Ur: 0.2 mg/dL (ref 0.2–1.0)
pH, UA: 7 (ref 5.0–7.5)

## 2020-11-26 LAB — CBC WITH DIFFERENTIAL/PLATELET
Basophils Absolute: 0.1 10*3/uL (ref 0.0–0.2)
Basos: 1 %
EOS (ABSOLUTE): 0.1 10*3/uL (ref 0.0–0.4)
Eos: 1 %
Hematocrit: 36.4 % (ref 34.0–46.6)
Hemoglobin: 12.6 g/dL (ref 11.1–15.9)
Immature Grans (Abs): 0 10*3/uL (ref 0.0–0.1)
Immature Granulocytes: 0 %
Lymphocytes Absolute: 3.4 10*3/uL — ABNORMAL HIGH (ref 0.7–3.1)
Lymphs: 38 %
MCH: 29.6 pg (ref 26.6–33.0)
MCHC: 34.6 g/dL (ref 31.5–35.7)
MCV: 85 fL (ref 79–97)
Monocytes Absolute: 0.6 10*3/uL (ref 0.1–0.9)
Monocytes: 7 %
Neutrophils Absolute: 4.7 10*3/uL (ref 1.4–7.0)
Neutrophils: 53 %
Platelets: 241 10*3/uL (ref 150–450)
RBC: 4.26 x10E6/uL (ref 3.77–5.28)
RDW: 11.8 % (ref 11.7–15.4)
WBC: 8.8 10*3/uL (ref 3.4–10.8)

## 2020-11-26 LAB — ABO AND RH: Rh Factor: POSITIVE

## 2020-11-26 LAB — RPR: RPR Ser Ql: NONREACTIVE

## 2020-11-26 LAB — TSH: TSH: 2.88 u[IU]/mL (ref 0.450–4.500)

## 2020-11-26 LAB — ANTIBODY SCREEN: Antibody Screen: NEGATIVE

## 2020-11-26 LAB — VARICELLA ZOSTER ANTIBODY, IGG: Varicella zoster IgG: 1594 index (ref >165)

## 2020-11-26 LAB — HIV ANTIBODY (ROUTINE TESTING W REFLEX): HIV Screen 4th Generation wRfx: NONREACTIVE

## 2020-11-26 LAB — RUBELLA SCREEN: Rubella Antibodies, IGG: 4.94 index (ref >0.99)

## 2020-11-26 LAB — HEPATITIS B SURFACE ANTIGEN: Hepatitis B Surface Ag: NEGATIVE

## 2020-11-26 NOTE — Progress Notes (Signed)
CBC white blood cells returned to normal. TSH for thyroid within normal limits. Sent message to patient in Mychart.

## 2020-11-27 LAB — GC/CHLAMYDIA PROBE AMP
Chlamydia trachomatis, NAA: NEGATIVE
Neisseria Gonorrhoeae by PCR: NEGATIVE

## 2020-11-27 LAB — URINE CULTURE, OB REFLEX

## 2020-11-27 LAB — CULTURE, OB URINE

## 2020-11-30 ENCOUNTER — Other Ambulatory Visit: Payer: Self-pay

## 2020-11-30 ENCOUNTER — Emergency Department: Payer: BC Managed Care – PPO

## 2020-11-30 ENCOUNTER — Telehealth: Payer: Self-pay

## 2020-11-30 ENCOUNTER — Emergency Department
Admission: EM | Admit: 2020-11-30 | Discharge: 2020-11-30 | Disposition: A | Discharge status: Home / Self Care | Payer: BC Managed Care – PPO | Attending: Student in an Organized Health Care Education/Training Program | Admitting: Student in an Organized Health Care Education/Training Program

## 2020-11-30 ENCOUNTER — Encounter: Payer: Self-pay | Admitting: Emergency Medicine

## 2020-11-30 DIAGNOSIS — O4691 Antepartum hemorrhage, unspecified, first trimester: Secondary | ICD-10-CM | POA: Diagnosis not present

## 2020-11-30 DIAGNOSIS — O209 Hemorrhage in early pregnancy, unspecified: Secondary | ICD-10-CM

## 2020-11-30 DIAGNOSIS — O039 Complete or unspecified spontaneous abortion without complication: Secondary | ICD-10-CM | POA: Insufficient documentation

## 2020-11-30 DIAGNOSIS — Z3A01 Less than 8 weeks gestation of pregnancy: Secondary | ICD-10-CM | POA: Diagnosis not present

## 2020-11-30 DIAGNOSIS — O208 Other hemorrhage in early pregnancy: Secondary | ICD-10-CM | POA: Diagnosis not present

## 2020-11-30 LAB — CBC
HCT: 39.1 % (ref 36.0–46.0)
Hemoglobin: 13.3 g/dL (ref 12.0–15.0)
MCH: 29.6 pg (ref 26.0–34.0)
MCHC: 34 g/dL (ref 30.0–36.0)
MCV: 86.9 fL (ref 80.0–100.0)
Platelets: 243 10*3/uL (ref 150–400)
RBC: 4.5 MIL/uL (ref 3.87–5.11)
RDW: 12.4 % (ref 11.5–15.5)
WBC: 10.5 10*3/uL (ref 4.0–10.5)
nRBC: 0 % (ref 0.0–0.2)

## 2020-11-30 LAB — HCG, QUANTITATIVE, PREGNANCY: hCG, Beta Chain, Quant, S: 6294 m[IU]/mL — ABNORMAL HIGH (ref <5)

## 2020-11-30 LAB — ABO/RH: ABO/RH(D): A POS

## 2020-11-30 LAB — POC URINE PREG, ED: Preg Test, Ur: POSITIVE — AB

## 2020-11-30 NOTE — Telephone Encounter (Signed)
Patient called in stating that she has yet to hear from a nurse, informed patient that her MyChart is active and that the nurse had reached out to her earlier this morning checking on her. Patient stated she doesn't have access to MyChart and denied my request to help her log back in. Patient states she is heading to the emergency room. Informed patient I would let her providers nurse know and that they would be in touch with her. Patient requested a call to [phone].

## 2020-11-30 NOTE — ED Provider Notes (Signed)
[facility] Emergency Department Provider Note  ____________________________________________   First MD Initiated Contact with Patient 11/30/20 [phone]     (approximate)  I have reviewed the triage vital signs and the nursing notes.   HISTORY  Chief Complaint Vaginal Bleeding    HPI Alexandra Haney is a 24 y.o. female is approximately [redacted] weeks pregnant presents emergency department with vaginal bleeding.  Patient states started about 10 AM with brownish-red blood.  She states that it began to get heavier.  Some cramping.  Patient had an ultrasound that confirmed an IUP more than 10 days ago at her GYN office.  She is not bleeding through more than 1 pad per hour.    Past Medical History:  Diagnosis Date  . Anemia     Patient Active Problem List   Diagnosis Date Noted  . Positive pregnancy test 11/04/2020  . Body mass index 29.0-29.9, adult 11/04/2020  . Less than [redacted] weeks gestation of pregnancy 11/04/2020  . Absence of menstruation 11/04/2020  . Esophagitis, reflux 08/10/2016  . Anxiety disorder 10/15/2008    Past Surgical History:  Procedure Laterality Date  . KIDNEY SURGERY    . KIDNEY SURGERY     reflux    Prior to Admission medications   Not on File    Allergies Nickel, Penicillin g, and Cephalosporins  Family History  Problem Relation Age of Onset  . Endometriosis Mother   . Fibroids Mother   . Diabetes Mother   . Cancer Maternal Aunt   . Diabetes Maternal Grandfather   . Diabetes Paternal Grandmother     Social History Social History   Tobacco Use  . Smoking status: Never Smoker  . Smokeless tobacco: Never Used  Vaping Use  . Vaping Use: Never used  Substance Use Topics  . Alcohol use: No  . Drug use: No    Review of Systems  Constitutional: No fever/chills Eyes: No visual changes. ENT: No sore throat. Respiratory: Denies cough Cardiovascular: Denies chest pain Gastrointestinal: Denies abdominal  pain Genitourinary: Positive vaginal bleeding cramping Musculoskeletal: Negative for back pain. Skin: Negative for rash. Psychiatric: no mood changes,     ____________________________________________   PHYSICAL EXAM:  VITAL SIGNS: ED Triage Vitals  Enc Vitals Group     BP 11/30/20 1449 105/78     Pulse Rate 11/30/20 1449 88     Resp 11/30/20 1449 20     Temp 11/30/20 1449 98.6 F (37 C)     Temp Source 11/30/20 1449 Oral     SpO2 11/30/20 1449 100 %     Weight 11/30/20 1450 166 lb 0.1 oz (75.3 kg)     Height 11/30/20 1450 5\' 3"  (1.6 m)     Head Circumference --      Peak Flow --      Pain Score 11/30/20 1449 5     Pain Loc --      Pain Edu? --      Excl. in GC? --     Constitutional: Alert and oriented. Well appearing and in no acute distress. Eyes: Conjunctivae are normal.  Head: Atraumatic. Nose: No congestion/rhinnorhea. Mouth/Throat: Mucous membranes are moist.   Neck:  supple no lymphadenopathy noted Cardiovascular: Normal rate, regular rhythm. Heart sounds are normal Respiratory: Normal respiratory effort.  No retractions, lungs c t a  Abd: soft nontender bs normal all 4 quad GU: deferred by the patient Musculoskeletal: FROM all extremities, warm and well perfused Neurologic:  Normal speech and language.  Skin:  Skin is warm, dry and intact. No rash noted. Psychiatric: Mood and affect are normal. Speech and behavior are normal.  ____________________________________________   LABS (all labs ordered are listed, but only abnormal results are displayed)  Labs Reviewed  HCG, QUANTITATIVE, PREGNANCY - Abnormal; Notable for the following components:      Result Value   hCG, Beta Chain, Quant, S 6,294 (*)    All other components within normal limits  POC URINE PREG, ED - Abnormal; Notable for the following components:   Preg Test, Ur POSITIVE (*)    All other components within normal limits  CBC  ABO/RH    ____________________________________________   ____________________________________________  RADIOLOGY  Ultrasound shows subchorionic bleed along with an appropriate growth as there is no embryo within the sac.  Radiologist states this is a definite miscarriage  ____________________________________________   PROCEDURES  Procedure(s) performed: No  Procedures    ____________________________________________   INITIAL IMPRESSION / ASSESSMENT AND PLAN / ED COURSE  Pertinent labs & imaging results that were available during my care of the patient were reviewed by me and considered in my medical decision making (see chart for details).   Patient is a 24 year old female presents with concerns of miscarriage.  See HPI.  Physical exam shows patient to appear well.  Vitals are normal and abdomen is nontender.  DDx: Threatened miscarriage, subchorionic hemorrhage, ectopic pregnancy.,  Miscarriage  Patient's blood type is a positive, POC pregnancy with positive beta hCG 6294, CBC is normal  Ultrasound does show a miscarriage and subchorionic hemorrhage Ultrasound was reviewed by me and radiology report was reviewed by me  I did explain the findings to the patient.  Encouraged her to follow-up with her GYN doctor for recheck.  If symptoms [facility] to [facility] for them to follow-up with her.  She is to return to the emergency department if she is bleeding for more than 1 pad per hour or has increasing abdominal pain.  She states she understands.  She was discharged stable condition.  She does not need RhoGam as she is a positive.     Alexandra Haney was evaluated in Emergency Department on 11/30/2020 for the symptoms described in the history of present illness. She was evaluated in the context of the global COVID-19 pandemic, which necessitated consideration that the patient might be at risk for infection with the SARS-CoV-2 virus that causes COVID-19. Institutional protocols and  algorithms that pertain to the evaluation of patients at risk for COVID-19 are in a state of rapid change based on information released by regulatory bodies including the CDC and federal and state organizations. These policies and algorithms were followed during the patient's care in the ED.    As part of my medical decision making, I reviewed the following data within the electronic MEDICAL RECORD NUMBER Nursing notes reviewed and incorporated, Labs reviewed , Old chart reviewed, Radiograph reviewed , Notes from prior ED visits and [place] Controlled Substance Database  ____________________________________________   FINAL CLINICAL IMPRESSION(S) / ED DIAGNOSES  Final diagnoses:  Vaginal bleeding in pregnancy, first trimester      NEW MEDICATIONS STARTED DURING THIS VISIT:  New Prescriptions   No medications on file     Note:  This document was prepared using Dragon voice recognition software and may include unintentional dictation errors.    Faythe Ghee, PA-C 11/30/20 1825    Willy Eddy, MD 11/30/20 [phone]

## 2020-11-30 NOTE — Telephone Encounter (Signed)
mychart message sent to patient

## 2020-11-30 NOTE — ED Triage Notes (Signed)
Patient reports being [redacted] weeks pregnant, developed vaginal bleeding this am at approx 1000, heavier than spotting. +Cramping.

## 2020-11-30 NOTE — Telephone Encounter (Signed)
Patient called in stating that she is having some vaginal bleeding accompanied with abdominal cramping. Patient requested you call her by phone number [phone]. Patient is unsure if she should be concerned or not. Could you please advise?

## 2020-11-30 NOTE — Discharge Instructions (Addendum)
Follow-up with [facility].  You will need a repeat beta hCG in 1 week.  If you develop increasing abdominal pain or bleeding through 1 pad per hour please return emergency department

## 2020-12-01 ENCOUNTER — Other Ambulatory Visit: Payer: Self-pay | Admitting: Certified Nurse Midwife

## 2020-12-01 DIAGNOSIS — O209 Hemorrhage in early pregnancy, unspecified: Secondary | ICD-10-CM

## 2020-12-01 LAB — MONITOR DRUG PROFILE 14(MW)
Amphetamine Scrn, Ur: NEGATIVE ng/mL
BARBITURATE SCREEN URINE: NEGATIVE ng/mL
BENZODIAZEPINE SCREEN, URINE: NEGATIVE ng/mL
Buprenorphine, Urine: NEGATIVE ng/mL
CANNABINOIDS UR QL SCN: NEGATIVE ng/mL
Cocaine (Metab) Scrn, Ur: NEGATIVE ng/mL
Creatinine(Crt), U: 55.8 mg/dL (ref 20.0–300.0)
Fentanyl, Urine: NEGATIVE pg/mL
Meperidine Screen, Urine: NEGATIVE ng/mL
Methadone Screen, Urine: NEGATIVE ng/mL
OXYCODONE+OXYMORPHONE UR QL SCN: NEGATIVE ng/mL
Opiate Scrn, Ur: NEGATIVE ng/mL
Ph of Urine: 7.4 (ref 4.5–8.9)
Phencyclidine Qn, Ur: NEGATIVE ng/mL
Propoxyphene Scrn, Ur: NEGATIVE ng/mL
SPECIFIC GRAVITY: 1.018
Tramadol Screen, Urine: NEGATIVE ng/mL

## 2020-12-07 ENCOUNTER — Other Ambulatory Visit: Payer: Self-pay

## 2020-12-07 ENCOUNTER — Telehealth: Payer: Self-pay

## 2020-12-07 ENCOUNTER — Other Ambulatory Visit: Payer: BC Managed Care – PPO

## 2020-12-07 DIAGNOSIS — O209 Hemorrhage in early pregnancy, unspecified: Secondary | ICD-10-CM

## 2020-12-07 NOTE — Telephone Encounter (Signed)
mychart message sent to patient

## 2020-12-08 ENCOUNTER — Telehealth: Payer: Self-pay

## 2020-12-08 ENCOUNTER — Other Ambulatory Visit: Payer: Self-pay | Admitting: Certified Nurse Midwife

## 2020-12-08 DIAGNOSIS — O209 Hemorrhage in early pregnancy, unspecified: Secondary | ICD-10-CM

## 2020-12-08 LAB — BETA HCG QUANT (REF LAB): hCG Quant: 118 m[IU]/mL

## 2020-12-08 NOTE — Telephone Encounter (Signed)
Patient called in stating that she is unsure why nurse sharon called her wanting her to have repeat ultrasound and repeat lab however she had a miscarriage and that all of those things are unnecessary. Informed patient that she was asking to see the provider at her last appointment to make sure that everything was out, and that her provider had placed an order for her to have an ultrasound.  Patient denied scheduling appointments, she stated that she would just see her primary care.  Informed patient I would send a message back to her provider to let them know.

## 2020-12-08 NOTE — Telephone Encounter (Signed)
Voice mail message left for patient- she needs a repeat Beta quant on Friday 12/11/20. She also needs an ultra sound this week. Also how is her bleeding

## 2020-12-21 ENCOUNTER — Encounter: Payer: BC Managed Care – PPO | Admitting: Certified Nurse Midwife

## 2021-07-12 IMAGING — US US OB < 14 WEEKS - US OB TV
1 series · 14 of 28 positions shown · non-contrast
Comparison: None.

CLINICAL DATA: Pelvic pain and cramping. Endometriosis. First
trimester pregnancy.

EXAM:
OBSTETRIC <14 WK US AND TRANSVAGINAL OB US
TECHNIQUE: Both transabdominal and transvaginal ultrasound examinations were
performed for complete evaluation of the gestation as well as the
maternal uterus, adnexal regions, and pelvic cul-de-sac.
Transvaginal technique was performed to assess early pregnancy.

[Series 1: ob us · 14 of 111 slices shown]
[im 5/111]
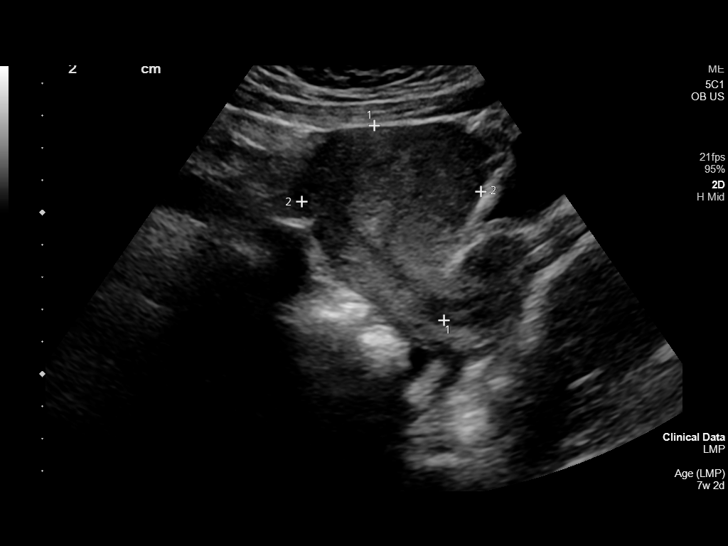
[im 13/111]
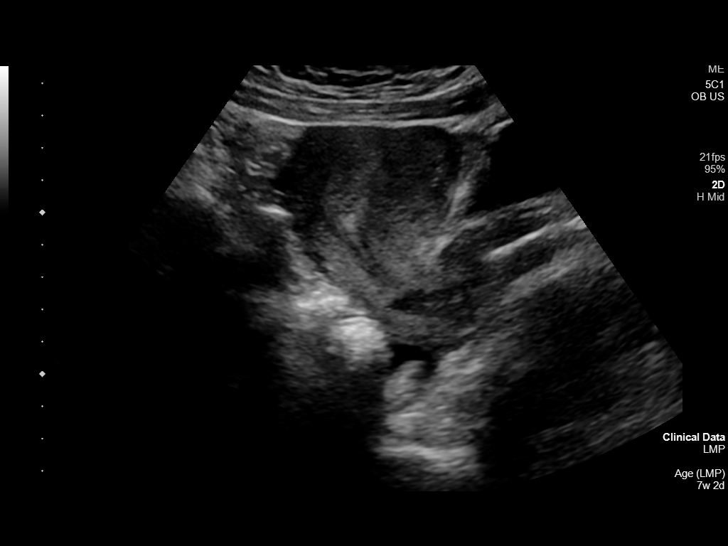
[im 21/111]
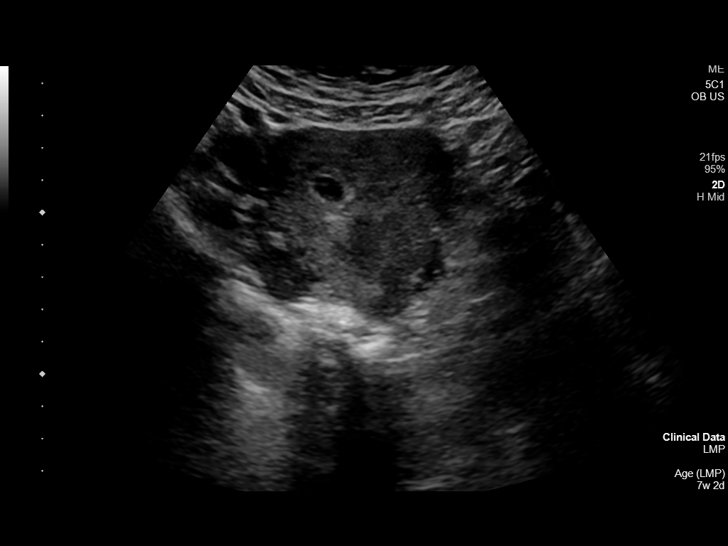
[im 29/111]
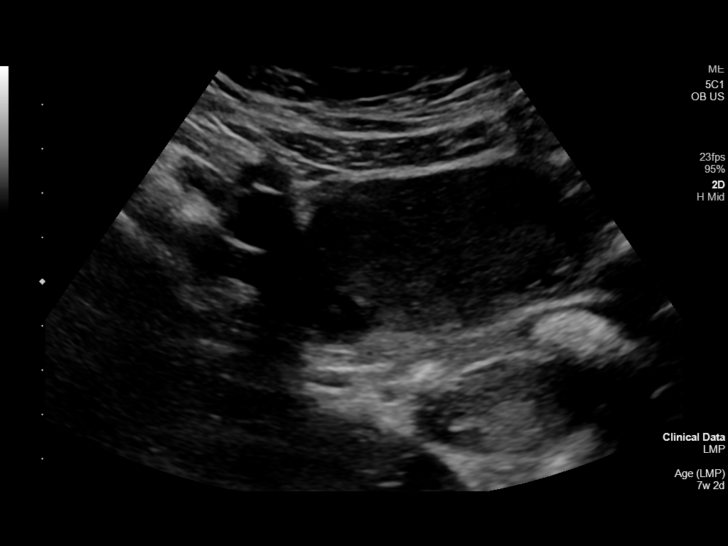
[im 37/111]
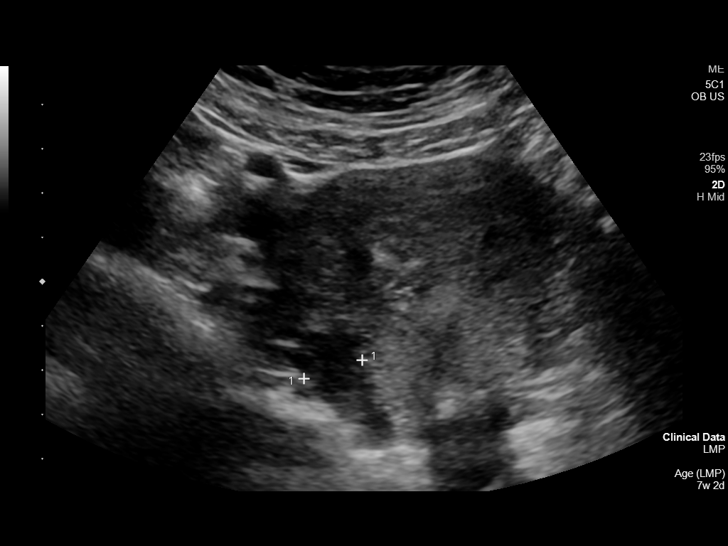
[im 45/111]
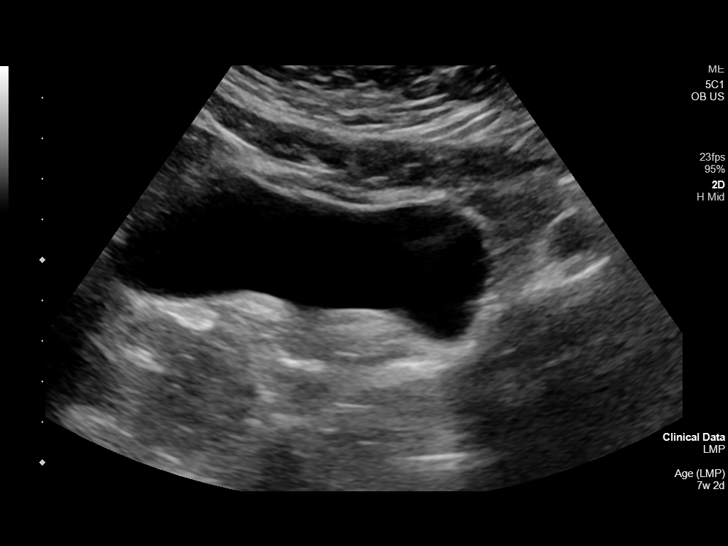
[im 53/111]
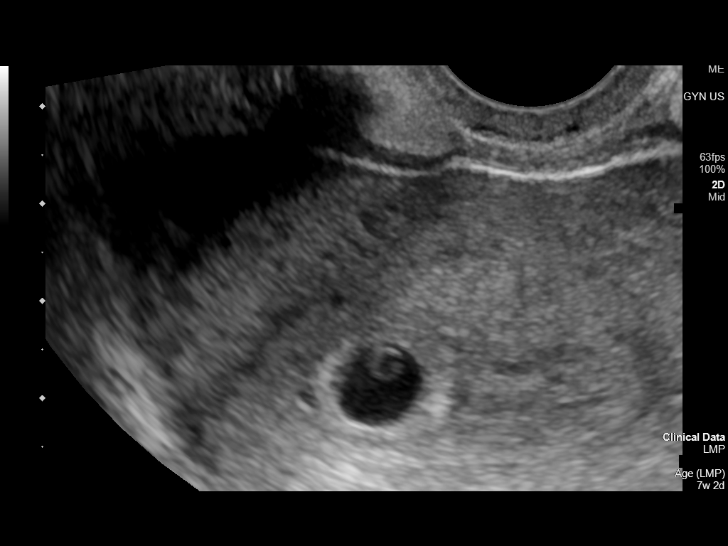
[im 62/111]
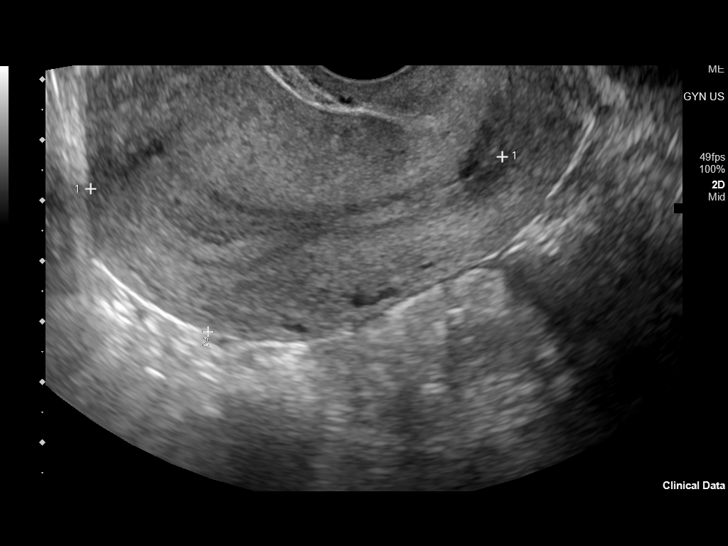
[im 70/111]
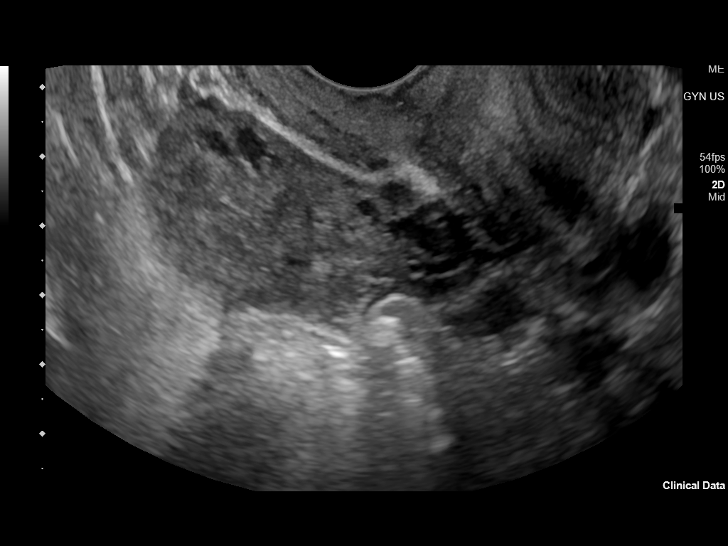
[im 78/111]
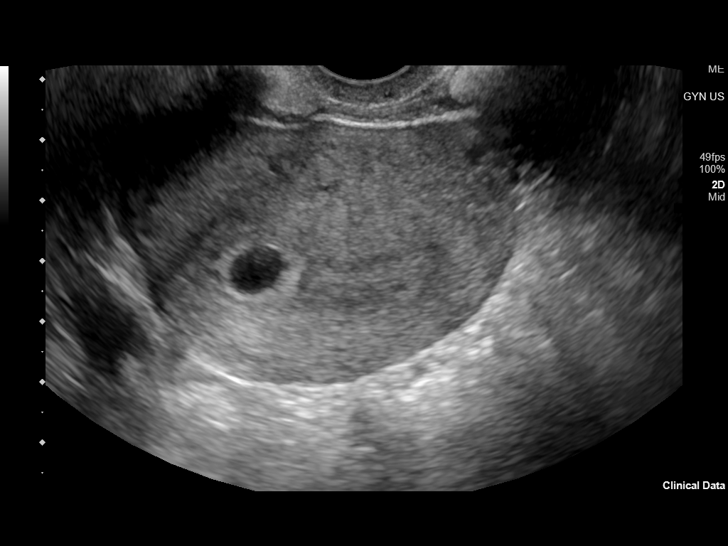
[im 86/111]
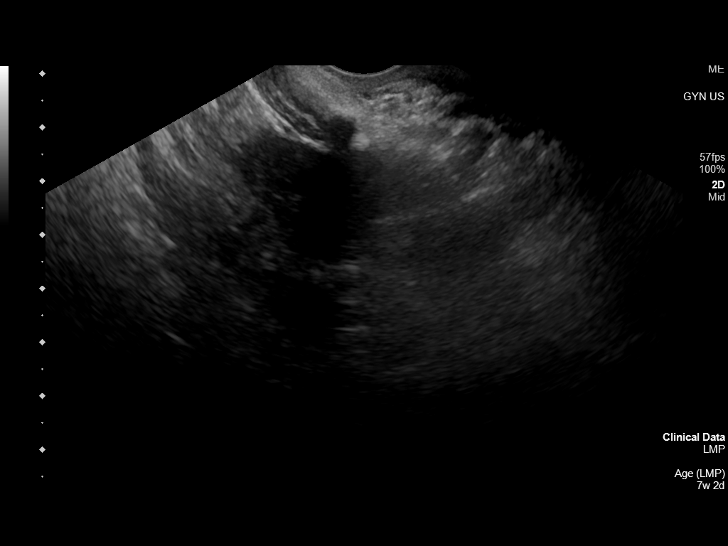
[im 94/111]
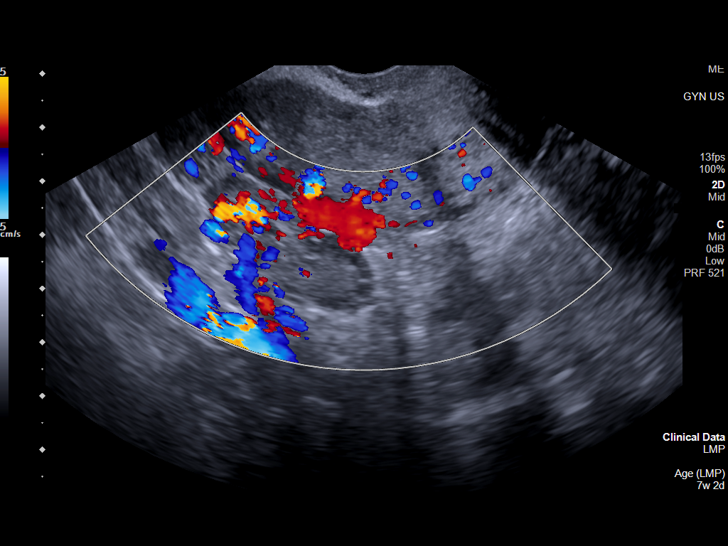
[im 102/111]
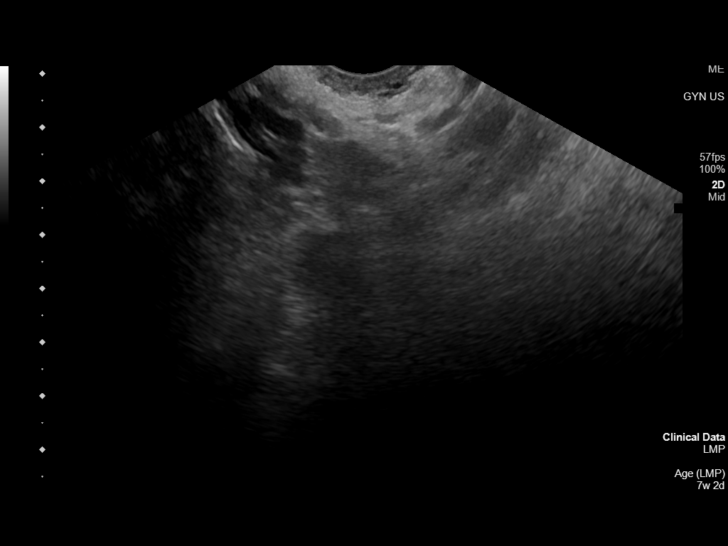
[im 111/111]
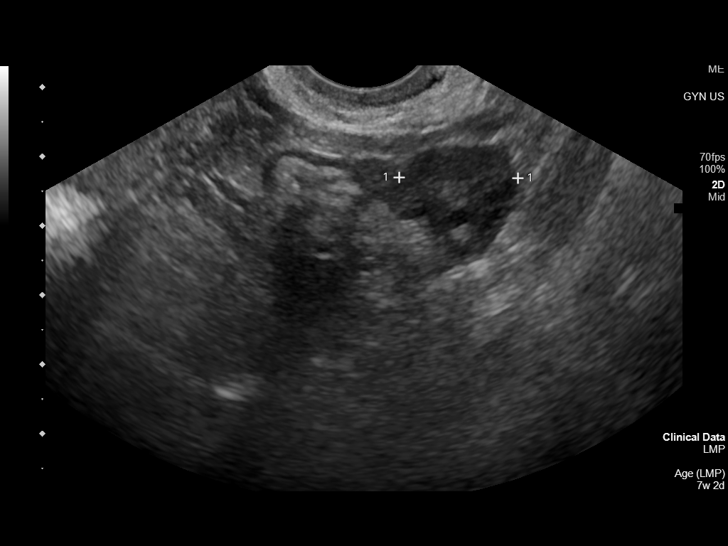

[14 of 28 positions shown; findings below may reference images not displayed]

FINDINGS: Intrauterine gestational sac: Single

Yolk sac:  Visualized.

Embryo:  Not Visualized.

MSD:  9 mm   5 w   5 d

Subchorionic hemorrhage:  None visualized.

Maternal uterus/adnexae: Both ovaries are normal in appearance. No
mass or abnormal free fluid identified.
IMPRESSION: Single intrauterine gestational sac measuring 5 weeks 5 days by mean
sac diameter. Suggest correlation with serial b-hCG levels, and
consider followup ultrasound to assess viability in 10 days.

## 2021-07-15 DIAGNOSIS — Z20822 Contact with and (suspected) exposure to covid-19: Secondary | ICD-10-CM | POA: Diagnosis not present

## 2021-07-15 DIAGNOSIS — R509 Fever, unspecified: Secondary | ICD-10-CM | POA: Diagnosis not present

## 2021-07-15 DIAGNOSIS — U071 COVID-19: Secondary | ICD-10-CM | POA: Diagnosis not present

## 2021-08-02 IMAGING — US US OB < 14 WEEKS - US OB TV
1 series · 13 of 28 positions shown · non-contrast
Comparison: 11/09/2020

CLINICAL DATA: Vaginal bleeding for 7 hours, first trimester of
pregnancy, LMP 09/19/2020

EXAM:
OBSTETRIC <14 WK US AND TRANSVAGINAL OB US
TECHNIQUE: Both transabdominal and transvaginal ultrasound examinations were
performed for complete evaluation of the gestation as well as the
maternal uterus, adnexal regions, and pelvic cul-de-sac.
Transvaginal technique was performed to assess early pregnancy.

[Series 1: us ob less than 14 weeks with ob transvaginal · 13 of 110 slices shown]
[im 5/110]
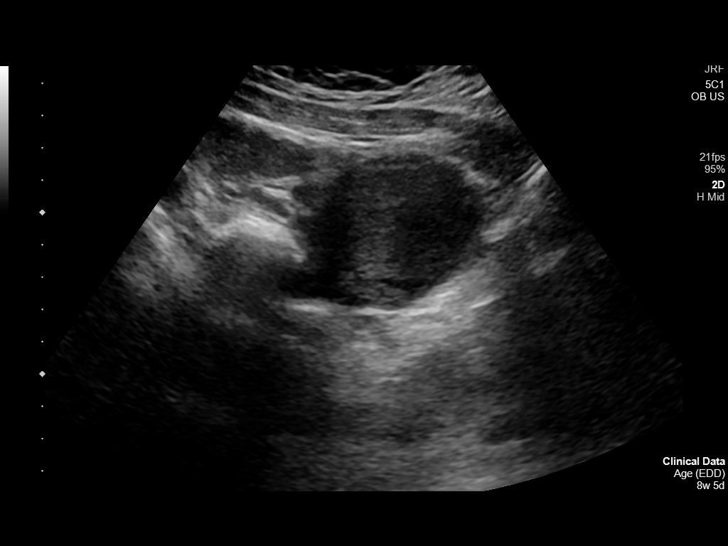
[im 13/110]
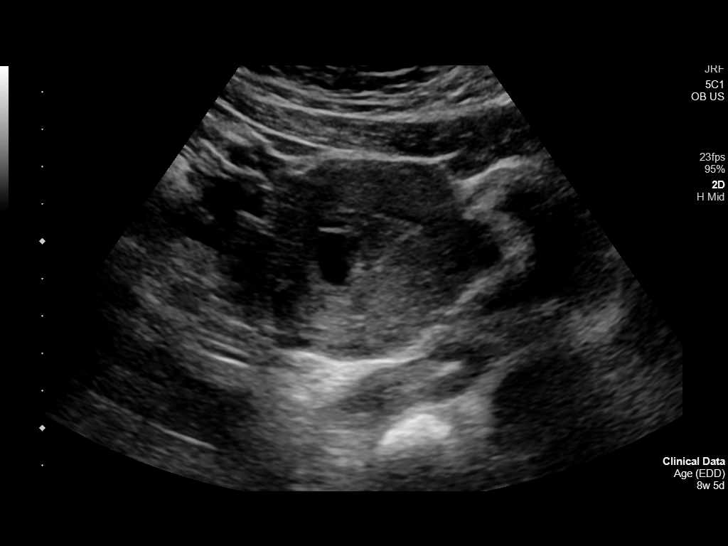
[im 21/110]
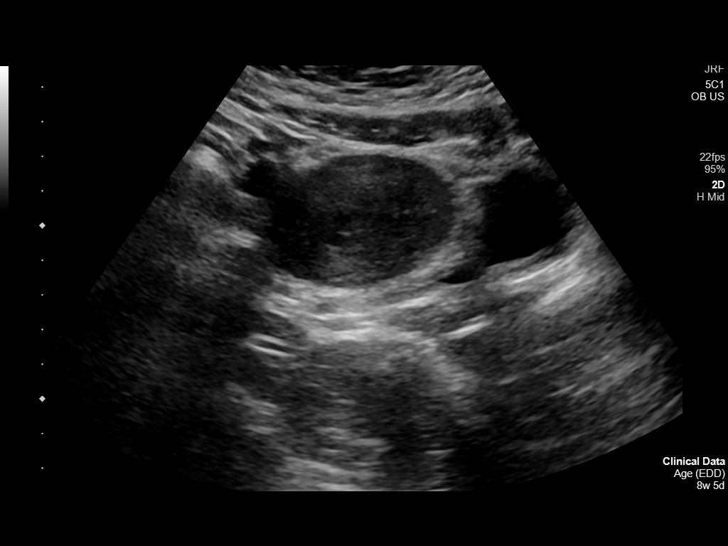
[im 29/110]
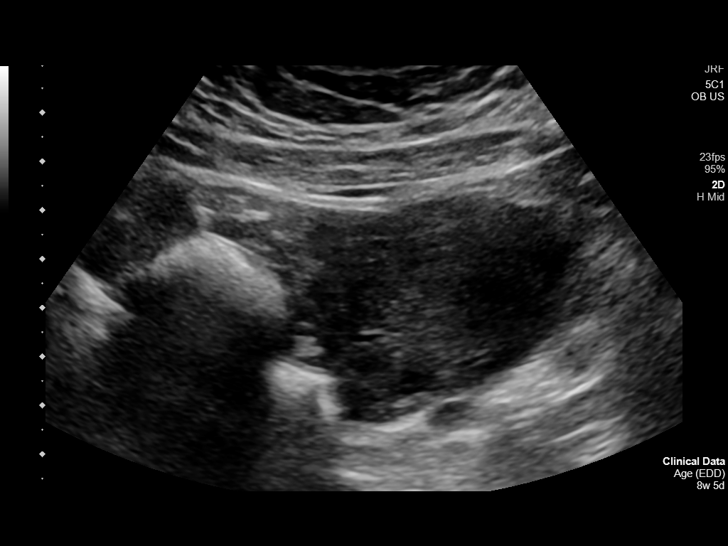
[im 37/110]
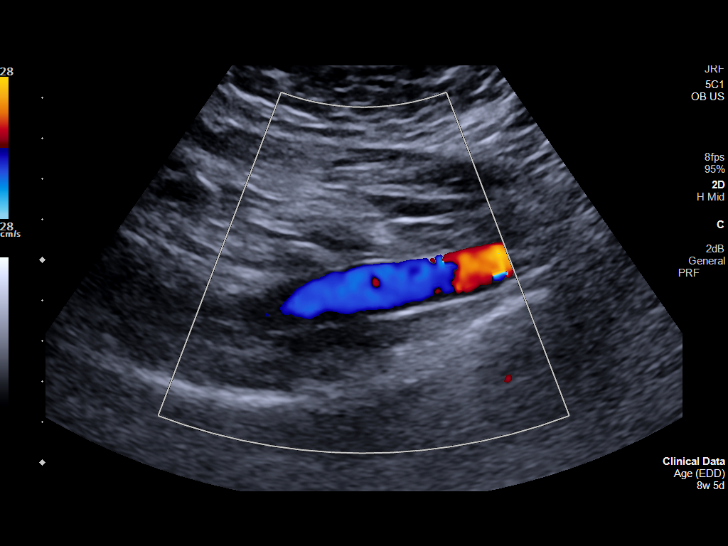
[im 45/110]
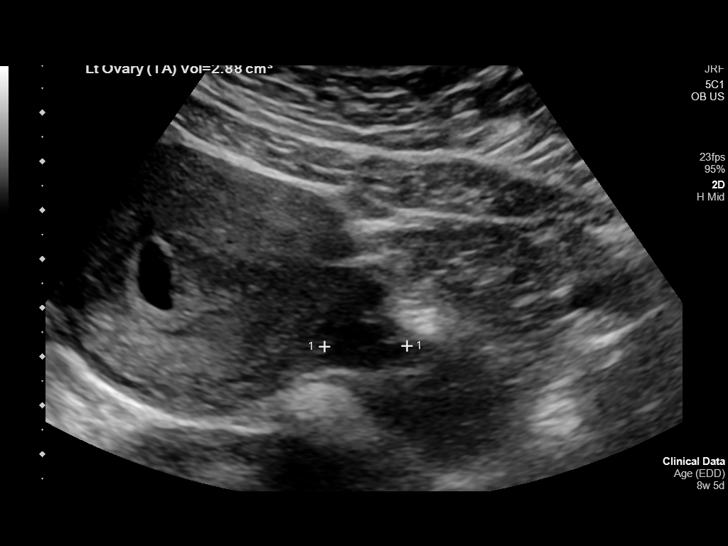
[im 57/110]
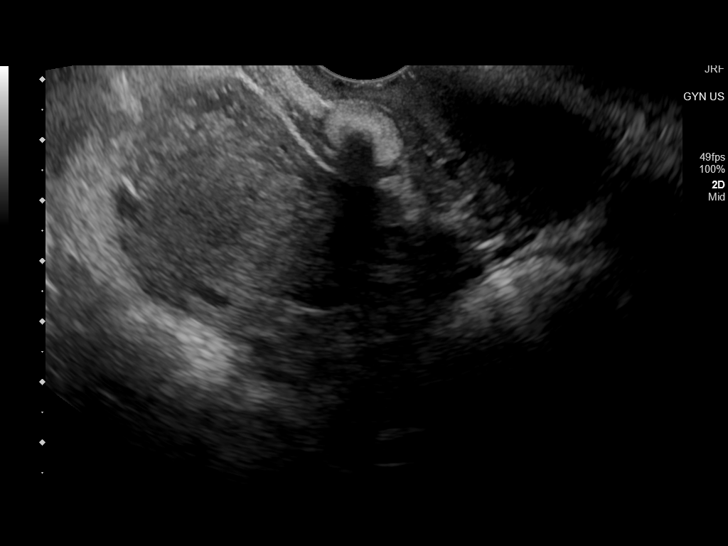
[im 65/110]
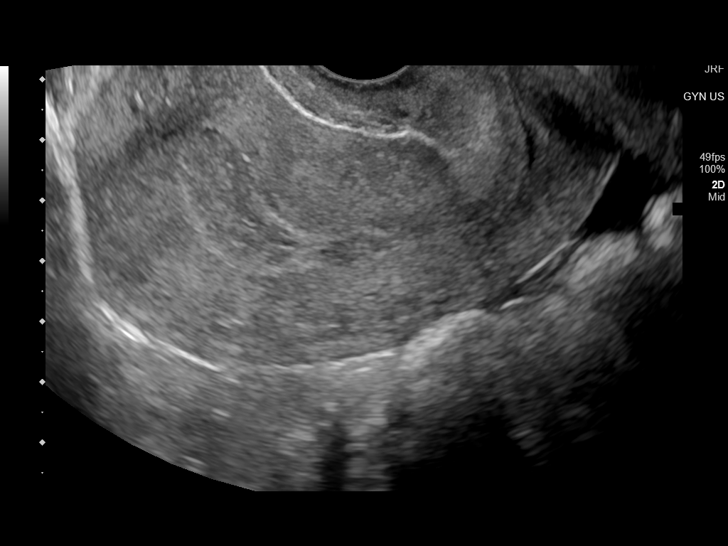
[im 73/110]
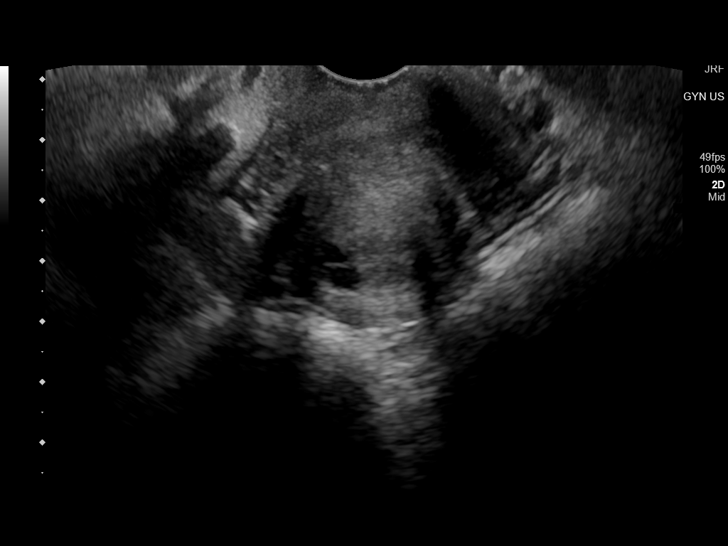
[im 81/110]
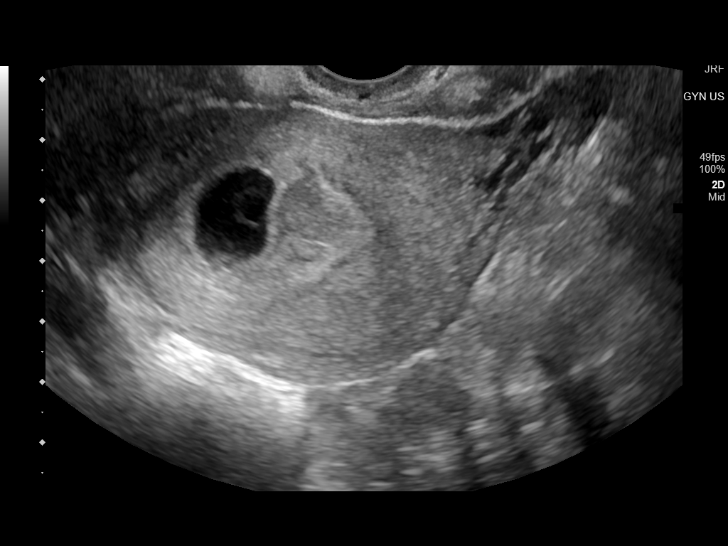
[im 89/110]
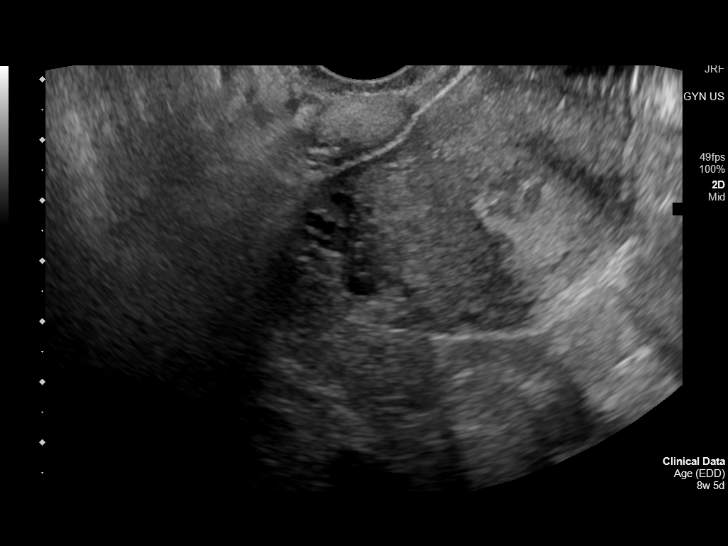
[im 97/110]
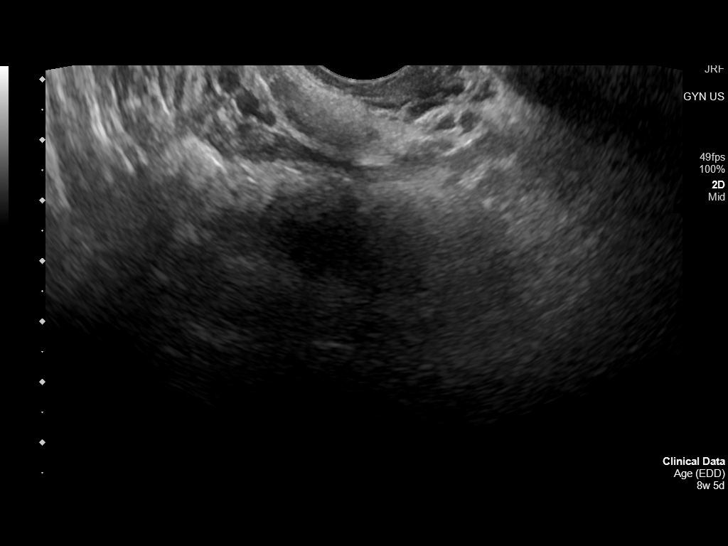
[im 105/110]
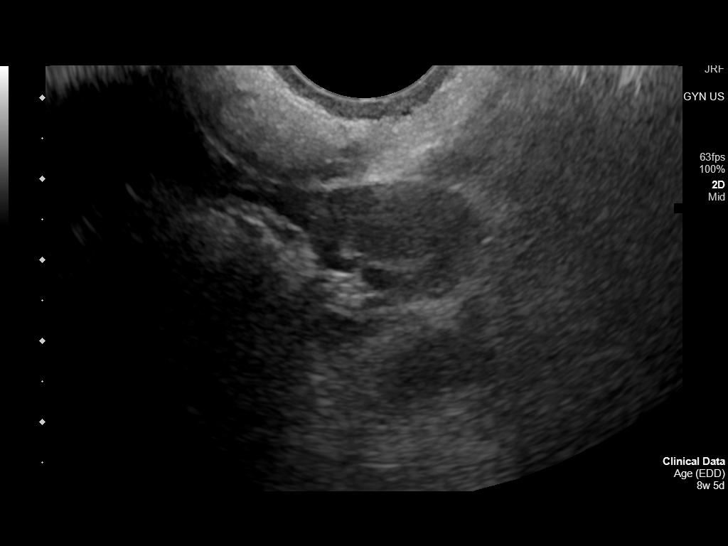

[13 of 28 positions shown; findings below may reference images not displayed]

FINDINGS: Intrauterine gestational sac: Present, single

Yolk sac:  Present

Embryo:  Not definitely visualized, see below

Cardiac Activity: N/A

Heart Rate: N/A  bpm

MSD: 16.1 mm   6 w   3 d

CRL:    mm    w    d                  US EDC:

Subchorionic hemorrhage:  Small subchronic hemorrhage

Maternal uterus/adnexae:

Thick yolk sac on the previous exam appears more collapsed on
current study.

An additional thin curvilinear sac is seen within the gestational
sac on the current exam, 6.6 mm diameter, uncertain if represents a
yolk sac or early amnion development.

No definite fetal pole visualized.

Ovaries unremarkable.

Complex cystic area at the lower uterine segment posteriorly 14 x 7
x 8 mm, new, lacking internal blood flow on color Doppler imaging.

No adnexal masses.

Small amount of nonspecific free pelvic fluid.
IMPRESSION: Mean sac diameter of 16.1 mm corresponding to 6 weeks 3 days is
seen.

This represents suboptimal growth since the prior study.

No definite fetal pole visualized.

Small subchronic hemorrhage.

The absence of an embryo more than 10 days following an ultrasound
that showed a gestational sac with a yolk sac is consistent with a
definite failed pregnancy.

This follows SRU consensus guidelines: Diagnostic Criteria for
Nonviable Pregnancy Early in the First Trimester. N Engl J Med

## 2021-12-13 ENCOUNTER — Other Ambulatory Visit: Payer: Self-pay

## 2021-12-13 ENCOUNTER — Emergency Department
Admission: EM | Admit: 2021-12-13 | Discharge: 2021-12-13 | Disposition: A | Discharge status: Home / Self Care | Payer: Worker's Compensation | Attending: Emergency Medicine | Admitting: Emergency Medicine

## 2021-12-13 DIAGNOSIS — H5789 Other specified disorders of eye and adnexa: Secondary | ICD-10-CM | POA: Diagnosis present

## 2021-12-13 DIAGNOSIS — H10211 Acute toxic conjunctivitis, right eye: Secondary | ICD-10-CM | POA: Diagnosis not present

## 2021-12-13 DIAGNOSIS — T550X1A Toxic effect of soaps, accidental (unintentional), initial encounter: Secondary | ICD-10-CM | POA: Insufficient documentation

## 2021-12-13 MED ORDER — GENTAMICIN SULFATE 0.3 % OP SOLN: 1.0000 [drp] | Freq: Four times a day (QID) | OPHTHALMIC | 0 refills | Status: AC

## 2021-12-13 MED ORDER — FLUORESCEIN SODIUM 1 MG OP STRP
1.0000 | ORAL_STRIP | Freq: Once | OPHTHALMIC | Status: AC
  Administered 2021-12-13: 11:00:00 1 via OPHTHALMIC
  Filled 2021-12-13: qty 1

## 2021-12-13 MED ORDER — EYE WASH OPHTH SOLN
1.0000 [drp] | OPHTHALMIC | Status: DC | PRN
  Filled 2021-12-13 (×2): qty 118

## 2021-12-13 MED ORDER — TETRACAINE HCL 0.5 % OP SOLN
1.0000 [drp] | Freq: Once | OPHTHALMIC | Status: AC
  Administered 2021-12-13: 11:00:00 1 [drp] via OPHTHALMIC
  Filled 2021-12-13: qty 4

## 2021-12-13 NOTE — ED Triage Notes (Signed)
Pt states she was working last night and got piece of soap to right eye. Pt does want to file workmans comp. States has some eye irritation.

## 2021-12-13 NOTE — ED Provider Notes (Signed)
[facility] Emergency Department Provider Note  ____________________________________________   Event Date/Time   First MD Initiated Contact with Patient 12/13/21 [phone]     (approximate)  I have reviewed the triage vital signs and the nursing notes.   HISTORY  Chief Complaint Eye Injury   HPI Alexandra Haney is a 25 y.o. female presents to the ED with complaint of right eye irritation.  Patient states that she was work last evening at which time some solid soap chipped off and possibly hit her in the eye.  Patient states that she immediately went to an eyewash station and did irrigate copiously.  Patient states she woke this morning with irritation and some crustiness of her eyelashes.  She rates her pain as 6 out of 10.       Past Medical History:  Diagnosis Date   Anemia     Patient Active Problem List   Diagnosis Date Noted   Positive pregnancy test 11/04/2020   Body mass index 29.0-29.9, adult 11/04/2020   Less than [redacted] weeks gestation of pregnancy 11/04/2020   Absence of menstruation 11/04/2020   Esophagitis, reflux 08/10/2016   Anxiety disorder 10/15/2008    Past Surgical History:  Procedure Laterality Date   KIDNEY SURGERY     KIDNEY SURGERY     reflux    Prior to Admission medications   Medication Sig Start Date End Date Taking? Authorizing Provider  gentamicin (GARAMYCIN) 0.3 % ophthalmic solution Place 1 drop into the right eye 4 (four) times daily for 7 days. 12/13/21 12/20/21 Yes Bridget Hartshorn L, PA-C    Allergies Nickel, Penicillin g, and Cephalosporins  Family History  Problem Relation Age of Onset   Endometriosis Mother    Fibroids Mother    Diabetes Mother    Cancer Maternal Aunt    Diabetes Maternal Grandfather    Diabetes Paternal Grandmother     Social History Social History   Tobacco Use   Smoking status: Never   Smokeless tobacco: Never  Vaping Use   Vaping Use: Never used  Substance Use Topics    Alcohol use: No   Drug use: No    Review of Systems Constitutional: No fever/chills Eyes: No visual changes.  Right eye redness. ENT: No sore throat. Cardiovascular: Denies chest pain. Respiratory: Denies shortness of breath. Gastrointestinal: No abdominal pain.  No nausea, no vomiting.   Musculoskeletal: Negative for musculoskeletal pain. Skin: Negative for rash. Neurological: Negative for headaches, focal weakness or numbness. ____________________________________________   PHYSICAL EXAM:  VITAL SIGNS: ED Triage Vitals  Enc Vitals Group     BP 12/13/21 0906 (!) 127/57     Pulse Rate 12/13/21 0906 98     Resp 12/13/21 0906 17     Temp 12/13/21 0906 97.9 F (36.6 C)     Temp Source 12/13/21 0906 Oral     SpO2 12/13/21 0906 100 %     Weight 12/13/21 0906 170 lb (77.1 kg)     Height 12/13/21 0906 5\' 4"  (1.626 m)     Head Circumference --      Peak Flow --      Pain Score 12/13/21 0906 6     Pain Loc --      Pain Edu? --      Excl. in GC? --     Constitutional: Alert and oriented. Well appearing and in no acute distress. Eyes: Conjunctivae are normal on the left.  Right is mildly injected but no obvious foreign  body is noted.  PERRL. EOMI. tetracaine was applied to the right eye.  No foreign body noted.  Upper lid was everted without foreign body.  Fluorescein stain was negative for corneal abrasion.  Eye was then irrigated with eyewash. Head: Atraumatic. Neck: No stridor.   Cardiovascular: Good peripheral circulation. Respiratory: Normal respiratory effort.  No retractions.  Musculoskeletal: Moves upper and lower extremities without any difficulty.  Normal gait was noted. Neurologic:  Normal speech and language. No gross focal neurologic deficits are appreciated. No gait instability. Skin:  Skin is warm, dry and intact. No rash noted. Psychiatric: Mood and affect are normal. Speech and behavior are normal.  ____________________________________________   LABS (all  labs ordered are listed, but only abnormal results are displayed)  Labs Reviewed - No data to display  PROCEDURES  Procedure(s) performed (including Critical Care):  Procedures   ____________________________________________   INITIAL IMPRESSION / ASSESSMENT AND PLAN / ED COURSE  As part of my medical decision making, I reviewed the following data within the electronic MEDICAL RECORD NUMBER Notes from prior ED visits and [place] Controlled Substance Database  25 year old female presents to the ED with irritation to the right eye after getting a particle of solid soap to her eye while at work last evening.  Patient states she was able to irrigate her eye immediately at an eyewash station.  She woke this morning with irritation to her eye and lashes crusted over.  On exam there was no foreign body and no corneal abrasion noted.  Patient was put on gentamicin ophthalmic solution 1 drop 4 times daily.  She is to follow-up with Dr. Brooke Dare who is on-call for ophthalmology if she continues to have any problems with her right eye.   ____________________________________________   FINAL CLINICAL IMPRESSION(S) / ED DIAGNOSES  Final diagnoses:  Chemical conjunctivitis of right eye     ED Discharge Orders          Ordered    gentamicin (GARAMYCIN) 0.3 % ophthalmic solution  4 times daily        12/13/21 1019             Note:  This document was prepared using Dragon voice recognition software and may include unintentional dictation errors.    Tommi Rumps, PA-C 12/13/21 1042    Chesley Noon, MD 12/14/21 [phone]

## 2021-12-13 NOTE — Discharge Instructions (Addendum)
Call make an appoint with Dr. Brooke Dare who is on-call for ophthalmology if you continue to have any problems with your eye.  A prescription for eyedrops was sent to your pharmacy.

## 2021-12-13 NOTE — ED Notes (Signed)
Workmans comp profile not in our system, pt attempting to [employer].

## 2021-12-26 NOTE — L&D Delivery Note (Signed)
Delivery Note  First Stage: Labor onset: 08/23/22 at 1700 Augmentation : cytotec, Pitocin Analgesia /Anesthesia intrapartum: epidural SROM at 1717 on 08/23/22  Second Stage: Complete dilation at 0100 Onset of pushing at 0100 FHR second stage Cat II  Delivery of a viable female infant on 08/24/22 at 0219 by CNM delivery of fetal head in LOA position with restitution to LOT. No nuchal cord;  Anterior then posterior shoulders delivered easily with gentle downward traction. Baby placed on mom's chest, and attended to by peds.  Cord double clamped after cessation of pulsation, cut by FOB   Third Stage: Placenta delivered spontaneously intact with 3VC @ 0226 Placenta disposition: routine disposal, noted marginal cord insertion.  Uterine tone Firm / bleeding scant  1st deg perineal laceration identified  Anesthesia for repair: epidural Repair 2-0 Vicryl CT-1 Est. Blood Loss (mL): 125  Complications: none  Mom to postpartum.  Baby to Couplet care / Skin to Skin.  Newborn: Birth Weight: pending  Apgar Scores: 8/9 Feeding planned: breast   Randa Ngo, CNM 08/24/2022 2:41 AM

## 2021-12-29 ENCOUNTER — Other Ambulatory Visit: Payer: Self-pay

## 2021-12-29 ENCOUNTER — Encounter: Payer: Self-pay | Admitting: Family Medicine

## 2021-12-29 ENCOUNTER — Other Ambulatory Visit: Payer: Self-pay | Admitting: Family Medicine

## 2021-12-29 ENCOUNTER — Ambulatory Visit (INDEPENDENT_AMBULATORY_CARE_PROVIDER_SITE_OTHER): Payer: BC Managed Care – PPO | Admitting: Family Medicine

## 2021-12-29 ENCOUNTER — Ambulatory Visit: Payer: BC Managed Care – PPO | Admitting: Family Medicine

## 2021-12-29 VITALS — BP 142/87 | HR 90 | Resp 16 | Wt 172.4 lb

## 2021-12-29 DIAGNOSIS — Z3201 Encounter for pregnancy test, result positive: Secondary | ICD-10-CM | POA: Diagnosis not present

## 2021-12-29 DIAGNOSIS — N912 Amenorrhea, unspecified: Secondary | ICD-10-CM | POA: Diagnosis not present

## 2021-12-29 LAB — POCT URINE PREGNANCY: Preg Test, Ur: POSITIVE — AB

## 2021-12-29 NOTE — Assessment & Plan Note (Signed)
Home and office test in place

## 2021-12-29 NOTE — Progress Notes (Signed)
Established patient visit   Patient: Alexandra Haney   DOB: Jan 03, 1996   25 y.o. Female  MRN: [id number] Visit Date: 12/29/2021  Today's healthcare provider: Jacky Kindle, FNP   Chief Complaint  Patient presents with   Possible Pregnancy    Patient presents in office today to confirm pregnancy after testing positive at home 3 days ago. Patient reports that she has been experiencing some abdominal cramping and breast tenderness.    Subjective    HPI HPI     Possible Pregnancy    Additional comments: Patient presents in office today to confirm pregnancy after testing positive at home 3 days ago. Patient reports that she has been experiencing some abdominal cramping and breast tenderness.       Last edited by Fonda Kinder, CMA on 12/29/2021  2:45 PM.        Medications: No outpatient medications prior to visit.   No facility-administered medications prior to visit.    Review of Systems     Objective    BP (!) 142/87    Pulse 90    Resp 16    Wt 172 lb 6.4 oz (78.2 kg)    LMP 12/11/2021 (Exact Date)    SpO2 99%    BMI 29.59 kg/m    Physical Exam Vitals and nursing note reviewed.  Constitutional:      General: She is not in acute distress.    Appearance: Normal appearance. She is overweight. She is not ill-appearing, toxic-appearing or diaphoretic.  HENT:     Head: Normocephalic and atraumatic.  Cardiovascular:     Rate and Rhythm: Normal rate and regular rhythm.     Pulses: Normal pulses.     Heart sounds: Normal heart sounds. No murmur heard.   No friction rub. No gallop.  Pulmonary:     Effort: Pulmonary effort is normal. No respiratory distress.     Breath sounds: Normal breath sounds. No stridor. No wheezing, rhonchi or rales.  Chest:     Chest wall: No tenderness.  Abdominal:     General: Bowel sounds are normal.     Palpations: Abdomen is soft.  Musculoskeletal:        General: No swelling, tenderness, deformity or signs of injury. Normal  range of motion.     Right lower leg: No edema.     Left lower leg: No edema.  Skin:    General: Skin is warm and dry.     Capillary Refill: Capillary refill takes less than 2 seconds.     Coloration: Skin is not jaundiced or pale.     Findings: No bruising, erythema, lesion or rash.  Neurological:     General: No focal deficit present.     Mental Status: She is alert and oriented to person, place, and time. Mental status is at baseline.     Cranial Nerves: No cranial nerve deficit.     Sensory: No sensory deficit.     Motor: No weakness.     Coordination: Coordination normal.  Psychiatric:        Mood and Affect: Mood normal.        Behavior: Behavior normal.        Thought Content: Thought content normal.        Judgment: Judgment normal.      Results for orders placed or performed in visit on 12/29/21  POCT urine pregnancy  Result Value Ref Range   Preg Test, Ur Positive (  A) Negative    Assessment & Plan     Problem List Items Addressed This Visit       Other   Positive pregnancy test    Home and office test in place      Relevant Orders   Ambulatory referral to Obstetrics / Gynecology   Absence of menstruation - Primary    Noted last period 11/11/21 Home test confirmed with repeat OV test      Relevant Orders   POCT urine pregnancy (Completed)   B-HCG Quant     Return if symptoms worsen or fail to improve.      Leilani Merl, FNP, have reviewed all documentation for this visit. The documentation on 12/29/21 for the exam, diagnosis, procedures, and orders are all accurate and complete.    Jacky Kindle, FNP  [facility] [phone] (phone) [phone] (fax)  [facility]

## 2021-12-29 NOTE — Assessment & Plan Note (Signed)
Noted last period 11/11/21 Home test confirmed with repeat OV test

## 2021-12-29 NOTE — Patient Instructions (Signed)
Following a healthy dietary pattern, including consumption of [18]:  Vegetables of all types: dark green, red, and orange vegetables; beans, peas, and lentils; starchy vegetables; other vegetables  Fruits, especially whole fruits  Grains, at least half of which should be whole grains  Dairy, including fat-free or low-fat milk, yogurt, and cheese, and/or lactose-free versions and fortified soy beverages and yogurt as alternatives  Protein foods, including lean meats, poultry, and eggs; seafood; beans, peas, and lentils; and nuts, seeds, and soy products  Oils, including vegetable oils and oils in food, such as in seafood and nuts  A variety of primarily whole, unprocessed foods and beverages should be consumed in appropriate amounts to allow adequate, but not excessive, gestational weight gain.  ?Limiting consumption of added sugars, saturated fat, and sodium - Pregnant people (and others) typically exceed recommended limits for these substances, and this may have negative health consequences. They should focus on increasing intake of high-quality, nutrient-dense foods and attempt to limit intake of processed "empty-calorie" foods and beverages. Avoiding or limiting consumption of highly processed foods is an important means of decreasing consumption of sugar, saturated fat, and sodium.  ?Appropriate vitamin and mineral supplementation - (See 'Dietary requirements' below.)  ?Appropriate gestational weight gain - Weight gain is routinely monitored throughout pregnancy. The [employer] (formerly [employer] [IOM]) has published widely used goals for gestational weight gain (table 1) [19].  Prepregnancy body mass index (BMI) and gestational weight gain have independent, but cumulative, effects on birth weight, maternal weight retention long after delivery, and possibly gestational duration and risk of childhood obesity; BMI is considered to have the stronger effect.  The incidence of pregnancy complications is higher at the upper and lower extremes of weight gain. (See "Gestational weight gain".)  ?Avoiding alcohol and other known or potential harmful substances (eg, mercury) - (See "Alcohol intake and pregnancy" and "Fish consumption and marine omega-3 fatty acid supplementation in pregnancy", section on 'Methylmercury in fish'.)  ?Limiting caffeine intake to less than 200 to 300 mg per day. (See "Caffeine: Effects on reproductive outcomes in females".)  ?Safe food handling - (See "Nutrition in pregnancy: Assessment and counseling", section on 'Food safety'.)

## 2021-12-31 ENCOUNTER — Telehealth: Payer: Self-pay | Admitting: Family Medicine

## 2021-12-31 NOTE — Telephone Encounter (Signed)
Copied from CRM [id number]. Topic: General - Other >> Dec 31, 2021  3:05 PM Jaquita Rector A wrote: Reason for CRM: Patient called in to inquire of Merita Norton about the lab results from two days ago states that she is anxious and need the results today please call Ph# [phone]

## 2021-12-31 NOTE — Telephone Encounter (Signed)
Contact [facility] customer service lab is still processing, lab tech states that specimen had came late in evening and that is why it is still processing. Patient advised that once results are received we will give her a call back, she has no other questions or concerns at this time. KW

## 2022-01-01 LAB — BETA HCG QUANT (REF LAB): hCG Quant: 61211 m[IU]/mL

## 2022-01-03 ENCOUNTER — Telehealth: Payer: Self-pay

## 2022-01-03 NOTE — Telephone Encounter (Signed)
Patient states that when she last saw you in office you had told her you would write letter for work, patient states that she works with [employer] and states that chemicals are harmful, she would like a letter stated that she can not work/be around chemical during pregnancy, please right note and advise. KW

## 2022-01-03 NOTE — Telephone Encounter (Signed)
Copied from CRM [id number]. Topic: General - Other >> Jan 03, 2022  8:31 AM Alexandra Haney wrote: Reason for CRM: Pt called and stated that she would like a call back from the nurse. 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 Please advise

## 2022-01-04 ENCOUNTER — Encounter: Payer: Self-pay | Admitting: Family Medicine

## 2022-01-05 ENCOUNTER — Telehealth: Payer: Self-pay

## 2022-01-05 NOTE — Telephone Encounter (Signed)
Copied from [place] [id number]. Topic: General - Other >> Jan 05, 2022  4:00 PM McGill, Nelva Bush wrote: Pt stated [facility] is requesting proof of pregnancy be sent to be able to make an appointment.  [facility]/  [place],  [id number]  Phone: [phone]

## 2022-01-06 NOTE — Telephone Encounter (Signed)
Results have been faxed. KW

## 2022-01-14 ENCOUNTER — Ambulatory Visit: Payer: Self-pay | Admitting: *Deleted

## 2022-01-14 NOTE — Telephone Encounter (Signed)
What are safe otc medications to take to treat cold like symptoms during pregnancy? KW

## 2022-01-14 NOTE — Telephone Encounter (Signed)
Reason for Disposition  Common cold with no complications    Pt is [redacted] weeks pregnant and wants to know what is safe to take for her symptoms.  Answer Assessment - Initial Assessment Questions 1. ONSET: "When did the nasal discharge start?"      Pt is [redacted] weeks pregnant and has developed symptoms of a cold. 2. AMOUNT: "How much discharge is there?"      Sore throat, runny nose, headache for 2 days. 3. COUGH: "Do you have a cough?" If yes, ask: "Describe the color of your sputum" (clear, white, yellow, green)     Slight cough.  Dry cough 4. RESPIRATORY DISTRESS: "Describe your breathing."      No 5. FEVER: "Do you have a fever?" If Yes, ask: "What is your temperature, how was it measured, and when did it start?"     No 6. SEVERITY: "Overall, how bad are you feeling right now?" (e.g., doesn't interfere with normal activities, staying home from school/work, staying in bed)      *No Answer* 7. OTHER SYMPTOMS: "Do you have any other symptoms?" (e.g., sore throat, earache, wheezing, vomiting)     *No Answer* 8. PREGNANCY: "Is there any chance you are pregnant?" "When was your last menstrual period?"     Yes 11 weeks.   I called my OB-GYN but they told me to call my PCP since I haven't been seen by them yet.   I was wanting to know what I could take that is safe while pregnant for my runny nose, sore throat and headache.   I'm using Ricolla Natural cough drops but wanted to be sure they were ok.  Protocols used: Common Cold-A-AH

## 2022-01-14 NOTE — Telephone Encounter (Signed)
°  Chief Complaint: [redacted] weeks pregnant.   Having cold symptoms.  What is safe to take? Symptoms: runny nose, sore throat and headache. Frequency: 2 days Pertinent Negatives: Patient denies shortness of breath, fever or chest tightness Disposition: [] ED /[] Urgent Care (no appt availability in office) / [] Appointment(In office/virtual)/ []  [place] [facility]/ [x] Home Care/ [] Refused Recommended Disposition /[] [facility]/ []  Follow-up with PCP Additional Notes: Message sent to , FNP asking what is safe to take for her symptoms since she is pregnant.   Someone will be in touch with pt.

## 2022-01-25 DIAGNOSIS — R87610 Atypical squamous cells of undetermined significance on cytologic smear of cervix (ASC-US): Secondary | ICD-10-CM | POA: Diagnosis not present

## 2022-01-25 DIAGNOSIS — O99210 Obesity complicating pregnancy, unspecified trimester: Secondary | ICD-10-CM | POA: Diagnosis not present

## 2022-01-25 DIAGNOSIS — Z113 Encounter for screening for infections with a predominantly sexual mode of transmission: Secondary | ICD-10-CM | POA: Diagnosis not present

## 2022-01-25 DIAGNOSIS — Z131 Encounter for screening for diabetes mellitus: Secondary | ICD-10-CM | POA: Diagnosis not present

## 2022-01-25 DIAGNOSIS — Z3481 Encounter for supervision of other normal pregnancy, first trimester: Secondary | ICD-10-CM | POA: Diagnosis not present

## 2022-01-25 DIAGNOSIS — Z1329 Encounter for screening for other suspected endocrine disorder: Secondary | ICD-10-CM | POA: Diagnosis not present

## 2022-01-25 DIAGNOSIS — Z3490 Encounter for supervision of normal pregnancy, unspecified, unspecified trimester: Secondary | ICD-10-CM | POA: Insufficient documentation

## 2022-01-25 DIAGNOSIS — O3680X0 Pregnancy with inconclusive fetal viability, not applicable or unspecified: Secondary | ICD-10-CM | POA: Diagnosis not present

## 2022-02-22 DIAGNOSIS — O99210 Obesity complicating pregnancy, unspecified trimester: Secondary | ICD-10-CM | POA: Diagnosis not present

## 2022-02-24 ENCOUNTER — Other Ambulatory Visit: Payer: Self-pay | Admitting: Family Medicine

## 2022-02-24 DIAGNOSIS — N912 Amenorrhea, unspecified: Secondary | ICD-10-CM

## 2022-02-24 DIAGNOSIS — Z3201 Encounter for pregnancy test, result positive: Secondary | ICD-10-CM

## 2022-03-22 DIAGNOSIS — Z3482 Encounter for supervision of other normal pregnancy, second trimester: Secondary | ICD-10-CM | POA: Diagnosis not present

## 2022-03-24 DIAGNOSIS — J309 Allergic rhinitis, unspecified: Secondary | ICD-10-CM | POA: Diagnosis not present

## 2022-03-24 DIAGNOSIS — J029 Acute pharyngitis, unspecified: Secondary | ICD-10-CM | POA: Diagnosis not present

## 2022-05-31 DIAGNOSIS — Z3482 Encounter for supervision of other normal pregnancy, second trimester: Secondary | ICD-10-CM | POA: Diagnosis not present

## 2022-05-31 DIAGNOSIS — Z23 Encounter for immunization: Secondary | ICD-10-CM | POA: Diagnosis not present

## 2022-07-26 DIAGNOSIS — Z3483 Encounter for supervision of other normal pregnancy, third trimester: Secondary | ICD-10-CM | POA: Diagnosis not present

## 2022-08-16 ENCOUNTER — Other Ambulatory Visit: Payer: Self-pay | Admitting: Obstetrics and Gynecology

## 2022-08-16 DIAGNOSIS — Z3490 Encounter for supervision of normal pregnancy, unspecified, unspecified trimester: Secondary | ICD-10-CM

## 2022-08-22 ENCOUNTER — Encounter: Payer: Self-pay | Admitting: Obstetrics

## 2022-08-23 ENCOUNTER — Inpatient Hospital Stay: Payer: BC Managed Care – PPO | Admitting: Anesthesiology

## 2022-08-23 ENCOUNTER — Other Ambulatory Visit: Payer: Self-pay

## 2022-08-23 ENCOUNTER — Inpatient Hospital Stay
Admission: EM | Admit: 2022-08-23 | Discharge: 2022-08-26 | DRG: 806 | Disposition: A | Discharge status: Home / Self Care | Payer: BC Managed Care – PPO | Attending: Obstetrics and Gynecology | Admitting: Obstetrics and Gynecology

## 2022-08-23 DIAGNOSIS — Z3A00 Weeks of gestation of pregnancy not specified: Secondary | ICD-10-CM | POA: Diagnosis not present

## 2022-08-23 DIAGNOSIS — O9081 Anemia of the puerperium: Secondary | ICD-10-CM | POA: Diagnosis not present

## 2022-08-23 DIAGNOSIS — O99214 Obesity complicating childbirth: Secondary | ICD-10-CM | POA: Diagnosis present

## 2022-08-23 DIAGNOSIS — Z880 Allergy status to penicillin: Secondary | ICD-10-CM

## 2022-08-23 DIAGNOSIS — D509 Iron deficiency anemia, unspecified: Secondary | ICD-10-CM | POA: Diagnosis present

## 2022-08-23 DIAGNOSIS — O480 Post-term pregnancy: Principal | ICD-10-CM | POA: Diagnosis present

## 2022-08-23 DIAGNOSIS — D62 Acute posthemorrhagic anemia: Secondary | ICD-10-CM | POA: Diagnosis not present

## 2022-08-23 DIAGNOSIS — Z3490 Encounter for supervision of normal pregnancy, unspecified, unspecified trimester: Principal | ICD-10-CM | POA: Diagnosis present

## 2022-08-23 DIAGNOSIS — Z833 Family history of diabetes mellitus: Secondary | ICD-10-CM | POA: Diagnosis not present

## 2022-08-23 DIAGNOSIS — Z3A40 40 weeks gestation of pregnancy: Secondary | ICD-10-CM | POA: Diagnosis not present

## 2022-08-23 LAB — COMPREHENSIVE METABOLIC PANEL
ALT: 17 U/L (ref 0–44)
AST: 19 U/L (ref 15–41)
Albumin: 2.8 g/dL — ABNORMAL LOW (ref 3.5–5.0)
Alkaline Phosphatase: 213 U/L — ABNORMAL HIGH (ref 38–126)
Anion gap: 7 (ref 5–15)
BUN: 13 mg/dL (ref 6–20)
CO2: 21 mmol/L — ABNORMAL LOW (ref 22–32)
Calcium: 9.2 mg/dL (ref 8.9–10.3)
Chloride: 107 mmol/L (ref 98–111)
Creatinine, Ser: 0.69 mg/dL (ref 0.44–1.00)
GFR, Estimated: >60 mL/min (ref >60)
Glucose, Bld: 122 mg/dL — ABNORMAL HIGH (ref 70–99)
Potassium: 3.8 mmol/L (ref 3.5–5.1)
Sodium: 135 mmol/L (ref 135–145)
Total Bilirubin: 0.5 mg/dL (ref 0.3–1.2)
Total Protein: 6.5 g/dL (ref 6.5–8.1)

## 2022-08-23 LAB — CBC
HCT: 33.5 % — ABNORMAL LOW (ref 36.0–46.0)
Hemoglobin: 10.7 g/dL — ABNORMAL LOW (ref 12.0–15.0)
MCH: 27.4 pg (ref 26.0–34.0)
MCHC: 31.9 g/dL (ref 30.0–36.0)
MCV: 85.7 fL (ref 80.0–100.0)
Platelets: 204 10*3/uL (ref 150–400)
RBC: 3.91 MIL/uL (ref 3.87–5.11)
RDW: 14.6 % (ref 11.5–15.5)
WBC: 13.5 10*3/uL — ABNORMAL HIGH (ref 4.0–10.5)
nRBC: 0 % (ref 0.0–0.2)

## 2022-08-23 LAB — PROTEIN / CREATININE RATIO, URINE
Creatinine, Urine: 95 mg/dL
Protein Creatinine Ratio: 0.19 mg/mg{Cre} — ABNORMAL HIGH (ref 0.00–0.15)
Total Protein, Urine: 18 mg/dL

## 2022-08-23 MED ORDER — LIDOCAINE-EPINEPHRINE (PF) 1.5 %-1:200000 IJ SOLN
INTRAMUSCULAR | Status: DC | PRN
  Administered 2022-08-23: 3 mL via PERINEURAL

## 2022-08-23 MED ORDER — PHENYLEPHRINE 80 MCG/ML (10ML) SYRINGE FOR IV PUSH (FOR BLOOD PRESSURE SUPPORT): 80.0000 ug | PREFILLED_SYRINGE | INTRAVENOUS | Status: DC | PRN

## 2022-08-23 MED ORDER — OXYTOCIN BOLUS FROM INFUSION: 333.0000 mL | Freq: Once | INTRAVENOUS | Status: DC

## 2022-08-23 MED ORDER — OXYTOCIN-SODIUM CHLORIDE 30-0.9 UT/500ML-% IV SOLN
2.5000 [IU]/h | INTRAVENOUS | Status: DC
  Filled 2022-08-23: qty 500

## 2022-08-23 MED ORDER — MISOPROSTOL 50MCG HALF TABLET
50.0000 ug | ORAL_TABLET | Freq: Once | ORAL | Status: AC
  Administered 2022-08-23: 50 ug via VAGINAL
  Filled 2022-08-23: qty 1

## 2022-08-23 MED ORDER — EPHEDRINE 5 MG/ML INJ: 10.0000 mg | INTRAVENOUS | Status: DC | PRN

## 2022-08-23 MED ORDER — MISOPROSTOL 50MCG HALF TABLET
50.0000 ug | ORAL_TABLET | Freq: Once | ORAL | Status: DC
  Filled 2022-08-23: qty 1

## 2022-08-23 MED ORDER — LACTATED RINGERS IV SOLN: INTRAVENOUS | Status: DC

## 2022-08-23 MED ORDER — OXYCODONE-ACETAMINOPHEN 5-325 MG PO TABS: 2.0000 | ORAL_TABLET | ORAL | Status: DC | PRN

## 2022-08-23 MED ORDER — LACTATED RINGERS IV SOLN: 500.0000 mL | INTRAVENOUS | Status: DC | PRN

## 2022-08-23 MED ORDER — EPHEDRINE 5 MG/ML INJ
10.0000 mg | INTRAVENOUS | Status: DC | PRN
Start: 2022-08-23 — End: 2022-08-24

## 2022-08-23 MED ORDER — FENTANYL-BUPIVACAINE-NACL 0.5-0.125-0.9 MG/250ML-% EP SOLN
12.0000 mL/h | EPIDURAL | Status: DC | PRN
  Administered 2022-08-23: 12 mL/h via EPIDURAL

## 2022-08-23 MED ORDER — LACTATED RINGERS IV SOLN
500.0000 mL | Freq: Once | INTRAVENOUS | Status: AC
  Administered 2022-08-23: 500 mL via INTRAVENOUS

## 2022-08-23 MED ORDER — SOD CITRATE-CITRIC ACID 500-334 MG/5ML PO SOLN: 30.0000 mL | ORAL | Status: DC | PRN

## 2022-08-23 MED ORDER — LIDOCAINE HCL (PF) 1 % IJ SOLN: 30.0000 mL | INTRAMUSCULAR | Status: DC | PRN

## 2022-08-23 MED ORDER — OXYTOCIN-SODIUM CHLORIDE 30-0.9 UT/500ML-% IV SOLN
1.0000 m[IU]/min | INTRAVENOUS | Status: DC
  Administered 2022-08-23: 2 m[IU]/min via INTRAVENOUS
  Filled 2022-08-23: qty 500

## 2022-08-23 MED ORDER — MISOPROSTOL 200 MCG PO TABS
ORAL_TABLET | ORAL | Status: AC
  Administered 2022-08-23: 50 ug via VAGINAL
  Filled 2022-08-23: qty 4

## 2022-08-23 MED ORDER — DIPHENHYDRAMINE HCL 50 MG/ML IJ SOLN: 12.5000 mg | INTRAMUSCULAR | Status: DC | PRN

## 2022-08-23 MED ORDER — ONDANSETRON HCL 4 MG/2ML IJ SOLN
4.0000 mg | Freq: Four times a day (QID) | INTRAMUSCULAR | Status: DC | PRN
  Administered 2022-08-23: 4 mg via INTRAVENOUS
  Filled 2022-08-23: qty 2

## 2022-08-23 MED ORDER — LIDOCAINE HCL (PF) 1 % IJ SOLN
INTRAMUSCULAR | Status: DC | PRN
  Administered 2022-08-23: 3 mL

## 2022-08-23 MED ORDER — TERBUTALINE SULFATE 1 MG/ML IJ SOLN
0.2500 mg | Freq: Once | INTRAMUSCULAR | Status: AC | PRN
  Administered 2022-08-23: 0.25 mg via SUBCUTANEOUS
  Filled 2022-08-23: qty 1

## 2022-08-23 MED ORDER — OXYCODONE-ACETAMINOPHEN 5-325 MG PO TABS: 1.0000 | ORAL_TABLET | ORAL | Status: DC | PRN

## 2022-08-23 MED ORDER — FENTANYL-BUPIVACAINE-NACL 0.5-0.125-0.9 MG/250ML-% EP SOLN
EPIDURAL | Status: AC
  Filled 2022-08-23: qty 250

## 2022-08-23 MED ORDER — ACETAMINOPHEN 325 MG PO TABS: 650.0000 mg | ORAL_TABLET | ORAL | Status: DC | PRN

## 2022-08-23 MED ORDER — BUPIVACAINE HCL (PF) 0.25 % IJ SOLN
INTRAMUSCULAR | Status: DC | PRN
  Administered 2022-08-23: 2 mL via EPIDURAL
  Administered 2022-08-23: 4 mL via EPIDURAL

## 2022-08-23 MED ORDER — AMMONIA AROMATIC IN INHA
RESPIRATORY_TRACT | Status: AC
  Filled 2022-08-23: qty 10

## 2022-08-23 MED ORDER — TERBUTALINE SULFATE 1 MG/ML IJ SOLN: 0.2500 mg | Freq: Once | INTRAMUSCULAR | Status: DC | PRN

## 2022-08-23 MED ORDER — PHENYLEPHRINE 80 MCG/ML (10ML) SYRINGE FOR IV PUSH (FOR BLOOD PRESSURE SUPPORT)
80.0000 ug | PREFILLED_SYRINGE | INTRAVENOUS | Status: DC | PRN
Start: 2022-08-23 — End: 2022-08-24

## 2022-08-23 NOTE — Anesthesia Preprocedure Evaluation (Addendum)
Anesthesia Evaluation  Patient identified by MRN, date of birth, ID band Patient awake    Reviewed: Allergy & Precautions, H&P , NPO status , Patient's Chart, lab work & pertinent test results, reviewed documented beta blocker date and time   Airway Mallampati: II  TM Distance: >3 FB Neck ROM: full    Dental no notable dental hx. (+) Teeth Intact   Pulmonary    Pulmonary exam normal breath sounds clear to auscultation       Cardiovascular Exercise Tolerance: Good negative cardio ROS   Rhythm:regular Rate:Normal     Neuro/Psych Anxiety negative neurological ROS  negative psych ROS   GI/Hepatic negative GI ROS, Neg liver ROS,   Endo/Other  negative endocrine ROSdiabetes, Gestational  Renal/GU      Musculoskeletal   Abdominal   Peds  Hematology  (+) Blood dyscrasia, anemia ,   Anesthesia Other Findings   Reproductive/Obstetrics (+) Pregnancy                             Anesthesia Physical Anesthesia Plan  ASA: 2  Anesthesia Plan: Epidural   Post-op Pain Management:    Induction:   PONV Risk Score and Plan:   Airway Management Planned:   Additional Equipment:   Intra-op Plan:   Post-operative Plan:   Informed Consent: I have reviewed the patients History and Physical, chart, labs and discussed the procedure including the risks, benefits and alternatives for the proposed anesthesia with the patient or authorized representative who has indicated his/her understanding and acceptance.       Plan Discussed with:   Anesthesia Plan Comments:         Anesthesia Quick Evaluation

## 2022-08-23 NOTE — Progress Notes (Signed)
Labor Progress Note  Alexandra Haney is a 26 y.o. G4P0020 at [redacted]w[redacted]d by LMP admitted for induction of labor due to Post dates. Due date 08/18/22.  Subjective: uncomfortable with UCs, 6/10 pain.   Objective: BP 118/87 (BP Location: Right Arm)   Pulse 89   Temp 98 F (36.7 C) (Oral)   Resp 15   Ht 5\' 4"  (1.626 m)   Wt 103.4 kg   LMP 11/11/2021   BMI 39.13 kg/m  Notable VS details: reviewed Vitals:   08/23/22 0913 08/23/22 1228  BP: 135/77 118/87     Fetal Assessment: FHT:  FHR: 145 bpm, variability: moderate,  accelerations:  Present,  decelerations:  Absent Category/reactivity:  Category I UC:   irregular, every 1-5 minutes SVE:   2/80/-1, soft/anterior  Membrane status:intact Amniotic color: n/a  Labs: Lab Results  Component Value Date   WBC 13.5 (H) 08/23/2022   HGB 10.7 (L) 08/23/2022   HCT 33.5 (L) 08/23/2022   MCV 85.7 08/23/2022   PLT 204 08/23/2022    Assessment / Plan: IOL at 40.5wks  Labor: s/p cytotec vaginal  x 1, repeat dose now.  Preeclampsia:   no e/o Pre-E, sent P/C ratio due to obesity and nulliparous. Pending.  Fetal Wellbeing:  Category I Pain Control:  Labor support without medications I/D:  n/a Anticipated MOD:  NSVD  08/25/2022, CNM 08/23/2022, 2:32 PM

## 2022-08-23 NOTE — Progress Notes (Signed)
Labor Progress Note  Alexandra Haney is a 26 y.o. G4P0020 at [redacted]w[redacted]d by LMP admitted for induction of labor due to Post dates. Due date 08/18/22.  Subjective: right side and back pain, constant, not feeling rectal pressure.   Objective: BP 109/60   Pulse 92   Temp 98 F (36.7 C) (Oral)   Resp 16   Ht 5\' 4"  (1.626 m)   Wt 103.4 kg   LMP 11/11/2021   SpO2 98%   BMI 39.13 kg/m  Notable VS details: reviewed Vitals:   08/23/22 1732 08/23/22 1735 08/23/22 1740 08/23/22 1745  BP: 106/69 107/66 110/67 119/75   08/23/22 1801 08/23/22 1815 08/23/22 1830 08/23/22 1831  BP: 117/69 118/79 99/64 99/64    08/23/22 1845 08/23/22 1945 08/23/22 2203 08/23/22 2233  BP: 106/61 (!) 107/56 (!) 110/48 109/60     Fetal Assessment: FHT:  FHR: 145 bpm, variability: moderate,  accelerations:  Present,  decelerations:  Absent Category/reactivity:  Category I UC:   irregular, every 1.5-4 minutes; Pitocin at 76mu/min  SVE:  Ant Lip/C/+1  Membrane status: SROM at 1717 Amniotic color:  mod meconium  Labs: Lab Results  Component Value Date   WBC 13.5 (H) 08/23/2022   HGB 10.7 (L) 08/23/2022   HCT 33.5 (L) 08/23/2022   MCV 85.7 08/23/2022   PLT 204 08/23/2022    Assessment / Plan: IOL at 40.5wks  Labor: s/p cytotec vaginal  x 2, low dose pitocin, cervical change, transition.  Preeclampsia:   no e/o Pre-E,  P/C ratio 0.19, normal CMP, labs done due to obesity and nulliparous.   Fetal Wellbeing:  Category I Pain Control:  Epidural I/D:  n/a Anticipated MOD:  NSVD  08/25/2022, CNM 08/23/2022, 11:32 PM

## 2022-08-23 NOTE — H&P (Signed)
OB History & Physical   History of Present Illness:  Chief Complaint: scheduled late term IOL  HPI:  Alexandra Haney is a 26 y.o. G77P0020 female at [redacted]w[redacted]d  dated by LMP and c/w [place] at [redacted]w[redacted]d; EDD 08/18/22.  She presents to L&D for scheduled IOL.   Active FM, having irreg UCs, denies LOF or VB.     Pregnancy Issues: Obesity in pregnancy - BMI 30.48 Iron deficiency anemia in pregnancy    Maternal Medical History:   Past Medical History:  Diagnosis Date   Anemia     Past Surgical History:  Procedure Laterality Date   KIDNEY SURGERY     KIDNEY SURGERY     reflux    Allergies  Allergen Reactions   Nickel Hives, Rash and Swelling   Penicillin G Hives, Itching, Rash and Swelling   Cephalosporins Hives and Itching    Prior to Admission medications   Medication Sig Start Date End Date Taking? Authorizing Provider  Prenatal Vit-Fe Fumarate-FA (PRENATAL MULTIVITAMIN) TABS tablet Take 1 tablet by mouth daily at 12 noon.   Yes [provider]     Prenatal care site: [facility]   Social History: She  reports that she has never smoked. She has never used smokeless tobacco. She reports that she does not drink alcohol and does not use drugs.  Family History: family history includes Cancer in her maternal aunt; Diabetes in her maternal grandfather, mother, and paternal grandmother; Endometriosis in her mother; Fibroids in her mother.   Review of Systems: A full review of systems was performed and negative except as noted in the HPI.     Physical Exam:  Vital Signs: LMP 11/11/2021   General: no acute distress.  HEENT: normocephalic, atraumatic Heart: regular rate & rhythm.  No murmurs/rubs/gallops Lungs: clear to auscultation bilaterally, normal respiratory effort Abdomen: soft, gravid, non-tender;  EFW: 8 lbs Pelvic:   External: Normal external female genitalia  Cervix: Dilation: 1 / Effacement (%): 70 / Station: -1 soft/anterior   Extremities:  non-tender, symmetric, no edema bilaterally.  DTRs: 2+  Neurologic: Alert & oriented x 3.    Results for orders placed or performed during the hospital encounter of 08/23/22 (from the past 24 hour(s))  CBC     Status: Abnormal   Collection Time: 08/23/22  9:32 AM  Result Value Ref Range   WBC 13.5 (H) 4.0 - 10.5 K/uL   RBC 3.91 3.87 - 5.11 MIL/uL   Hemoglobin 10.7 (L) 12.0 - 15.0 g/dL   HCT 53.9 (L) 76.7 - 34.1 %   MCV 85.7 80.0 - 100.0 fL   MCH 27.4 26.0 - 34.0 pg   MCHC 31.9 30.0 - 36.0 g/dL   RDW 93.7 90.2 - 40.9 %   Platelets 204 150 - 400 K/uL   nRBC 0.0 0.0 - 0.2 %    Pertinent Results:  Prenatal Labs: Blood type/Rh A Pos  Antibody screen neg  Rubella Immune  Varicella Immune  RPR NR  HBsAg Neg  HIV NR  GC neg  Chlamydia neg  Genetic screening  Declined   1 hour GTT  111  3 hour GTT   GBS  Neg   FHT: 140bpm, mod var, + accels, no decels TOCO: irreg, q5-10min SVE:  Dilation: 1 / Effacement (%): 70 / Station: -1  - soft/anterior   Cephalic by leopolds/SVE  No results found.  Assessment:  Alexandra Haney is a 26 y.o. G29P0030 female at [redacted]w[redacted]d with scheduled induction of  labor.   Plan:  1. Admit to Labor & Delivery; consents reviewed and obtained  2. Fetal Well being  - Fetal Tracing: Cat I - Group B Streptococcus ppx indicated: Neg - Presentation: cephalic confirmed by SVE/Leopolds   3. Routine OB: - Prenatal labs reviewed, as above - Rh A Pos - CBC, T&S, RPR on admit - Clear fluids, IVF  4. Induction of Labor -  Contractions: external toco in place -  Pelvis adequate for TOL.  -  Plan for induction with cytotec, AROM/Pitocin.  -  Plan for continuous fetal monitoring  -  Maternal pain control as desired - Anticipate vaginal delivery  5. Post Partum Planning: - Infant feeding: breast - Contraception: TBD - Tdap: 05/31/22   Randa Ngo, CNM 08/23/22 9:51 AM

## 2022-08-23 NOTE — Anesthesia Procedure Notes (Signed)
Epidural Patient location during procedure: OB Start time: 08/23/2022 5:13 PM End time: 08/23/2022 5:31 PM  Staffing Anesthesiologist: Yevette Edwards, MD Resident/CRNA: Elmarie Mainland, CRNA Performed: resident/CRNA   Preanesthetic Checklist Completed: patient identified, IV checked, site marked, risks and benefits discussed, surgical consent, monitors and equipment checked, pre-op evaluation and timeout performed  Epidural Patient position: sitting Prep: ChloraPrep Patient monitoring: heart rate, continuous pulse ox and blood pressure Approach: midline Location: L3-L4 Injection technique: LOR saline  Needle:  Needle type: Tuohy  Needle gauge: 17 G Needle length: 9 cm and 9 Needle insertion depth: 6 cm Catheter type: closed end flexible Catheter size: 19 Gauge Catheter at skin depth: 11 cm Test dose: negative and 1.5% lidocaine with Epi 1:200 K  Assessment Sensory level: T10 Events: blood not aspirated, injection not painful, no injection resistance, no paresthesia and negative IV test  Additional Notes 2 attempt Pt. Evaluated and documentation done after procedure finished. Patient identified. Risks/Benefits/Options discussed with patient including but not limited to bleeding, infection, nerve damage, paralysis, failed block, incomplete pain control, headache, blood pressure changes, nausea, vomiting, reactions to medication both or allergic, itching and postpartum back pain. Confirmed with bedside nurse the patient's most recent platelet count. Confirmed with patient that they are not currently taking any anticoagulation, have any bleeding history or any family history of bleeding disorders. Patient expressed understanding and wished to proceed. All questions were answered. Sterile technique was used throughout the entire procedure. Please see nursing notes for vital signs. Test dose was given through epidural catheter and negative prior to continuing to dose epidural or start  infusion. Warning signs of high block given to the patient including shortness of breath, tingling/numbness in hands, complete motor block, or any concerning symptoms with instructions to call for help. Patient was given instructions on fall risk and not to get out of bed. All questions and concerns addressed with instructions to call with any issues or inadequate analgesia.    Patient tolerated the insertion well without immediate complications.Reason for block:procedure for pain

## 2022-08-23 NOTE — Progress Notes (Signed)
Labor Progress Note  Alexandra Haney is a 26 y.o. G4P0020 at [redacted]w[redacted]d by LMP admitted for induction of labor due to Post dates. Due date 08/18/22.  Subjective: comfortable with epidural  Objective: BP (!) 107/56   Pulse (!) 110   Temp 98.2 F (36.8 C) (Oral)   Resp 16   Ht 5\' 4"  (1.626 m)   Wt 103.4 kg   LMP 11/11/2021   SpO2 98%   BMI 39.13 kg/m  Notable VS details: reviewed Vitals:   08/23/22 1728 08/23/22 1730 08/23/22 1732 08/23/22 1735  BP: 123/79 115/75 106/69 107/66   08/23/22 1740 08/23/22 1745 08/23/22 1801 08/23/22 1815  BP: 110/67 119/75 117/69 118/79   08/23/22 1830 08/23/22 1831 08/23/22 1845 08/23/22 1945  BP: 99/64 99/64 106/61 (!) 107/56     Fetal Assessment: FHT:  FHR: 145 bpm, variability: moderate,  accelerations:  Present,  decelerations:  Absent Category/reactivity:  Category I UC:   irregular, every 1-5 minutes - tachysystole with FHR decels around 1830; Terb given - continues to only have mild UCs, FHR Cat I.   SVE:  last exam by CNM at 1800 Dilation: 3 Effacement (%): 80 Cervical Position: Anterior Station: -1 Presentation: Vertex Exam by:: Isabela Nardelli CNM   Membrane status: SROM at 1717 Amniotic color:  mod meconium  Labs: Lab Results  Component Value Date   WBC 13.5 (H) 08/23/2022   HGB 10.7 (L) 08/23/2022   HCT 33.5 (L) 08/23/2022   MCV 85.7 08/23/2022   PLT 204 08/23/2022    Assessment / Plan: IOL at 40.5wks  Labor: s/p cytotec vaginal  x 2, will start Pitocin now.  Preeclampsia:   no e/o Pre-E,  P/C ratio 0.19, normal CMP, labs done due to obesity and nulliparous.   Fetal Wellbeing:  Category I Pain Control:  Epidural I/D:  n/a Anticipated MOD:  NSVD  08/25/2022, CNM 08/23/2022, 9:33 PM

## 2022-08-24 ENCOUNTER — Encounter: Payer: Self-pay | Admitting: Obstetrics and Gynecology

## 2022-08-24 LAB — CBC
HCT: 28.4 % — ABNORMAL LOW (ref 36.0–46.0)
Hemoglobin: 9 g/dL — ABNORMAL LOW (ref 12.0–15.0)
MCH: 27.5 pg (ref 26.0–34.0)
MCHC: 31.7 g/dL (ref 30.0–36.0)
MCV: 86.9 fL (ref 80.0–100.0)
Platelets: 187 10*3/uL (ref 150–400)
RBC: 3.27 MIL/uL — ABNORMAL LOW (ref 3.87–5.11)
RDW: 15 % (ref 11.5–15.5)
WBC: 17.2 10*3/uL — ABNORMAL HIGH (ref 4.0–10.5)
nRBC: 0 % (ref 0.0–0.2)

## 2022-08-24 LAB — RPR: RPR Ser Ql: NONREACTIVE

## 2022-08-24 MED ORDER — IBUPROFEN 600 MG PO TABS
ORAL_TABLET | ORAL | Status: AC
  Filled 2022-08-24: qty 1

## 2022-08-24 MED ORDER — IBUPROFEN 600 MG PO TABS
600.0000 mg | ORAL_TABLET | Freq: Four times a day (QID) | ORAL | Status: DC
  Administered 2022-08-24 – 2022-08-26 (×9): 600 mg via ORAL
  Filled 2022-08-24 (×8): qty 1

## 2022-08-24 MED ORDER — DIBUCAINE (PERIANAL) 1 % EX OINT: 1.0000 | TOPICAL_OINTMENT | CUTANEOUS | Status: DC | PRN

## 2022-08-24 MED ORDER — SIMETHICONE 80 MG PO CHEW: 80.0000 mg | CHEWABLE_TABLET | ORAL | Status: DC | PRN

## 2022-08-24 MED ORDER — BENZOCAINE-MENTHOL 20-0.5 % EX AERO
1.0000 | INHALATION_SPRAY | CUTANEOUS | Status: DC | PRN
  Administered 2022-08-24: 1 via TOPICAL
  Filled 2022-08-24 (×2): qty 56

## 2022-08-24 MED ORDER — SENNOSIDES-DOCUSATE SODIUM 8.6-50 MG PO TABS
2.0000 | ORAL_TABLET | Freq: Every day | ORAL | Status: DC
  Administered 2022-08-25 – 2022-08-26 (×2): 2 via ORAL
  Filled 2022-08-24 (×2): qty 2

## 2022-08-24 MED ORDER — COCONUT OIL OIL: 1.0000 | TOPICAL_OIL | Status: DC | PRN

## 2022-08-24 MED ORDER — PRENATAL MULTIVITAMIN CH
1.0000 | ORAL_TABLET | Freq: Every day | ORAL | Status: DC
  Administered 2022-08-24 – 2022-08-25 (×2): 1 via ORAL
  Filled 2022-08-24 (×2): qty 1

## 2022-08-24 MED ORDER — WITCH HAZEL-GLYCERIN EX PADS
1.0000 | MEDICATED_PAD | CUTANEOUS | Status: DC | PRN
  Filled 2022-08-24: qty 100

## 2022-08-24 MED ORDER — ACETAMINOPHEN 325 MG PO TABS
650.0000 mg | ORAL_TABLET | ORAL | Status: DC | PRN
  Administered 2022-08-24 – 2022-08-25 (×3): 650 mg via ORAL
  Filled 2022-08-24 (×3): qty 2

## 2022-08-24 MED ORDER — ONDANSETRON HCL 4 MG PO TABS: 4.0000 mg | ORAL_TABLET | ORAL | Status: DC | PRN

## 2022-08-24 MED ORDER — FERROUS SULFATE 325 (65 FE) MG PO TABS
325.0000 mg | ORAL_TABLET | Freq: Two times a day (BID) | ORAL | Status: DC
  Administered 2022-08-24 – 2022-08-26 (×5): 325 mg via ORAL
  Filled 2022-08-24 (×5): qty 1

## 2022-08-24 MED ORDER — ONDANSETRON HCL 4 MG/2ML IJ SOLN: 4.0000 mg | INTRAMUSCULAR | Status: DC | PRN

## 2022-08-24 MED ORDER — ZOLPIDEM TARTRATE 5 MG PO TABS: 5.0000 mg | ORAL_TABLET | Freq: Every evening | ORAL | Status: DC | PRN

## 2022-08-24 MED ORDER — SODIUM CHLORIDE 0.9 % IV SOLN
300.0000 mg | INTRAVENOUS | Status: DC
  Administered 2022-08-24: 300 mg via INTRAVENOUS
  Filled 2022-08-24: qty 300

## 2022-08-24 MED ORDER — DIPHENHYDRAMINE HCL 25 MG PO CAPS: 25.0000 mg | ORAL_CAPSULE | Freq: Four times a day (QID) | ORAL | Status: DC | PRN

## 2022-08-24 NOTE — Progress Notes (Signed)
Set up double electric breast pump. Mother able to pump out approximately 60mL and feed to infant via curved tip syringe after a 20 minute feed.

## 2022-08-24 NOTE — Progress Notes (Signed)
Post Partum Day 0 Subjective: Doing well, no complaints.  Tolerating regular diet, pain with PO meds, voiding and ambulating without difficulty.  No CP SOB Fever,Chills, N/V or leg pain; denies nipple or breast pain, no HA change of vision, RUQ/epigastric pain  Objective: BP 112/67 (BP Location: Right Arm)   Pulse 100   Temp 98.6 F (37 C) (Oral)   Resp 18   Ht 5\' 4"  (1.626 m)   Wt 103.4 kg   LMP 11/11/2021   SpO2 99%   Breastfeeding Unknown   BMI 39.13 kg/m    Physical Exam:  General: NAD Breasts: soft/nontender CV: RRR Pulm: nl effort, CTABL Abdomen: soft, NT, BS x 4 Perineum: minimal edema, repair well approximated Lochia: moderate Uterine Fundus: fundus firm and 1 fb below umbilicus DVT Evaluation: no cords, ttp LEs   Recent Labs    08/23/22 0932 08/24/22 0718  HGB 10.7* 9.0*  HCT 33.5* 28.4*  WBC 13.5* 17.2*  PLT 204 187    Assessment/Plan: 25 y.o. 08/26/22 postpartum day # 0  - Continue routine PP care - Lactation consult, she is having some difficulty latching baby - Discussed contraceptive options including implant, IUDs hormonal and non-hormonal, injection, pills/ring/patch, condoms, and NFP.  - Acute blood loss anemia - hemodynamically stable and asymptomatic; start po ferrous sulfate BID with stool softeners, Venofer ordered - Immunization status: all Imms up to date  Disposition: Does not desire Dc home today.   [id number], CNM 08/24/2022 9:14 AM

## 2022-08-24 NOTE — Discharge Summary (Signed)
Obstetrical Discharge Summary  Patient Name: Alexandra Haney DOB: 1996/10/31 MRN: [id number]  Date of Admission: 08/23/2022 Date of Delivery: 08/24/22 Delivered by: Heloise Ochoa, CNM  Date of Discharge: 08/26/2022  Primary OB: [facility] [id number] last menstrual period was 11/11/2021. EDC Estimated Date of Delivery: 08/18/22 Gestational Age at Delivery: [redacted]w[redacted]d   Antepartum complications:  Obesity in pregnancy - BMI 30.48 Iron deficiency anemia in pregnancy   Admitting Diagnosis: Encounter for induction of labor [Z34.90]  Secondary Diagnosis: Patient Active Problem List   Diagnosis Date Noted   Encounter for induction of labor 08/23/2022   Supervision of normal pregnancy 01/25/2022   Positive pregnancy test 11/04/2020   Body mass index 29.0-29.9, adult 11/04/2020   Less than [redacted] weeks gestation of pregnancy 11/04/2020   Absence of menstruation 11/04/2020   Esophagitis, reflux 08/10/2016   Anxiety disorder 10/15/2008    Discharge Diagnosis: Term Pregnancy Delivered      Augmentation: Pitocin and Cytotec Complications: None Intrapartum complications/course: admitted for IOL due to late term, cytotec x 2 doses, SROM meconium, low dose Pitocin and progression to C/C/+2; pushed 1hour to SVD. See delivery note.  Delivery Type: spontaneous vaginal delivery Anesthesia: epidural anesthesia Placenta: spontaneous To Pathology: No  Laceration: 1st degree Episiotomy: none Newborn Data: Live born female  Birth Weight:   APGAR: 8, 9  Newborn Delivery   Birth date/time: 08/24/2022 02:19:00 Delivery type: Vaginal, Spontaneous      Postpartum Procedures: none Edinburgh:     08/24/2022    2:10 PM  Edinburgh Postnatal Depression Scale Screening Tool  I have been able to laugh and see the funny side of things. 0  I have looked forward with enjoyment to things. 0  I have blamed myself unnecessarily when things went wrong. 0  I have been anxious or worried for no good  reason. 0  I have felt scared or panicky for no good reason. 0  Things have been getting on top of me. 0  I have been so unhappy that I have had difficulty sleeping. 0  I have felt sad or miserable. 0  I have been so unhappy that I have been crying. 0  The thought of harming myself has occurred to me. 0  Edinburgh Postnatal Depression Scale Total 0     Post partum course:  Patient had an uncomplicated postpartum course.  By time of discharge on PPD#2, her pain was controlled on oral pain medications; she had appropriate lochia and was ambulating, voiding without difficulty and tolerating regular diet.  She was deemed stable for discharge to home.     Discharge Physical Exam:  BP 122/77 (BP Location: Right Arm)   Pulse 100   Temp 98.4 F (36.9 C) (Oral)   Resp (!) 22   Ht 5\' 4"  (1.626 m)   Wt 103.4 kg   LMP 11/11/2021   SpO2 98%   Breastfeeding Unknown   BMI 39.13 kg/m   General: NAD CV: RRR Pulm: CTABL, nl effort ABD: s/nd/nt, fundus firm and below the umbilicus Lochia: moderate Perineum: minimal edema/repair well approximated DVT Evaluation: LE non-ttp, no evidence of DVT on exam.  Hemoglobin  Date Value Ref Range Status  08/24/2022 9.0 (L) 12.0 - 15.0 g/dL Final  08/26/2022 77/41/2878 11.1 - 15.9 g/dL Final   HCT  Date Value Ref Range Status  08/24/2022 28.4 (L) 36.0 - 46.0 % Final   Hematocrit  Date Value Ref Range Status  11/25/2020 36.4 34.0 - 46.6 % Final  Risk assessment for postpartum VTE and prophylactic treatment: Very high risk factors: None High risk factors: None Moderate risk factors: BMI 30-40 kg/m2  Postpartum VTE prophylaxis with LMWH not indicated  Disposition: stable, discharge to home. Baby Feeding: breast feeding Baby Disposition: home with mom  Rh Immune globulin indicated: No Rubella vaccine given: was not indicated Varivax vaccine given: was not indicated Flu vaccine given in AP setting: n/a Tdap vaccine given in AP setting: Yes    Contraception: condoms  Prenatal Labs:  Blood type/Rh A Pos  Antibody screen neg  Rubella Immune  Varicella Immune  RPR NR  HBsAg Neg  HIV NR  GC neg  Chlamydia neg  Genetic screening  Declined   1 hour GTT  111  3 hour GTT    GBS  Neg    Plan:  Corianne N Mayeux was discharged to home in good condition. Follow-up appointment with delivering provider in 6 weeks.  Discharge Medications: Allergies as of 08/26/2022       Reactions   Nickel Hives, Rash, Swelling   Penicillin G Hives, Itching, Rash, Swelling   Cephalosporins Hives, Itching        Medication List     TAKE these medications    acetaminophen 325 MG tablet Commonly known as: Tylenol Take 2 tablets (650 mg total) by mouth every 4 (four) hours as needed (for pain scale < 4).   ibuprofen 600 MG tablet Commonly known as: ADVIL Take 1 tablet (600 mg total) by mouth every 6 (six) hours as needed for mild pain or cramping.   prenatal multivitamin Tabs tablet Take 1 tablet by mouth daily at 12 noon.         Follow-up Information     McVey, Prudencio Pair, CNM. Schedule an appointment as soon as possible for a visit in 6 week(s).   Specialty: Obstetrics and Gynecology Contact information: [address] [employer] [place] [address] [phone]                 Signed:  Margaretmary Eddy, CNM Certified Nurse Midwife [facility]

## 2022-08-24 NOTE — Progress Notes (Signed)
Foley removed per protocol. 

## 2022-08-24 NOTE — Progress Notes (Signed)
Labor Progress Note  Alexandra Haney is a 26 y.o. G4P0020 at [redacted]w[redacted]d by LMP admitted for induction of labor due to Post dates. Due date 08/18/22.  Subjective: right side and back pain, constant, not feeling rectal pressure.   Objective: BP 124/80   Pulse 92   Temp 98.4 F (36.9 C) (Oral)   Resp 18   Ht 5\' 4"  (1.626 m)   Wt 103.4 kg   LMP 11/11/2021   SpO2 99%   BMI 39.13 kg/m  Notable VS details: reviewed Vitals:   08/23/22 1735 08/23/22 1740 08/23/22 1745 08/23/22 1801  BP: 107/66 110/67 119/75 117/69   08/23/22 1815 08/23/22 1830 08/23/22 1831 08/23/22 1845  BP: 118/79 99/64 99/64  106/61   08/23/22 1945 08/23/22 2203 08/23/22 2233 08/23/22 2332  BP: (!) 107/56 (!) 110/48 109/60 124/80     Fetal Assessment: FHT:  FHR: 145 bpm, variability: moderate,  accelerations:  Abscent,  decelerations:  Present variable and early decels with pushing.  Category/reactivity:  Category I UC:   irregular, every 1.5-2.5 minutes; Pitocin at 59mu/min  SVE:  C/C/+2  Membrane status: SROM at 1717 Amniotic color:  mod meconium  Labs: Lab Results  Component Value Date   WBC 13.5 (H) 08/23/2022   HGB 10.7 (L) 08/23/2022   HCT 33.5 (L) 08/23/2022   MCV 85.7 08/23/2022   PLT 204 08/23/2022    Assessment / Plan: IOL at 40.5wks  Labor: s/p cytotec vaginal  x 2, low dose pitocin, now 2nd stage, will begin pushing.  Preeclampsia:   no e/o Pre-E,  P/C ratio 0.19, normal CMP, labs done due to obesity and nulliparous.   Fetal Wellbeing:  Category I Pain Control:  Epidural I/D:  n/a Anticipated MOD:  NSVD  08/25/2022 Alexandra Haney, CNM 08/24/2022, 1:08 AM

## 2022-08-24 NOTE — Discharge Instructions (Signed)

## 2022-08-25 NOTE — Lactation Note (Signed)
This note was copied from a baby's chart. Lactation Consultation Note  Patient Name: Alexandra Haney OVANV'B Date: 08/25/2022 Reason for consult: Initial assessment;Difficult latch;Primapara;Term Age:25 hours  Maternal Data This is mom's first baby, NSVD. Mom reports at this time she is going to pump and offer baby breastmilk via bottle. Per mom baby has begun sucking better on the bottle and for now she is wanting to work on establishing her milk supply and bottle feed any milk she expresses and/or the formula supplement. Does the patient have breastfeeding experience prior to this delivery?: No  Feeding Mother's Current Feeding Choice: Breast Milk and Formula Nipple Type: Slow - flow   Interventions Interventions: Breast feeding basics reviewed;Education Reviewed goal of 8 pump sessions in 24 hours to establish a full milk supply. Discussed options for mom to consider going forward.in her feeding plan choice. Per mom she will consider information provided. Support and encouragement given. LC to follow-up with mom in am to see what her plan is and how to best support mom's choice.Marland Kitchen  Update provided to care nurse.  Consult Status Consult Status: Follow-up Date: 08/25/22 Follow-up type: In-patient    Fuller Song 08/25/2022, 4:40 PM

## 2022-08-25 NOTE — Progress Notes (Signed)
Post Partum Day 1  Subjective: Doing well, no concerns. Ambulating without difficulty, pain managed with PO meds, tolerating regular diet, and voiding without difficulty.   No fever/chills, chest pain, shortness of breath, nausea/vomiting, or leg pain. No nipple or breast pain. No headache, visual changes, or RUQ/epigastric pain.  Objective: BP 113/76 (BP Location: Right Arm)   Pulse 88   Temp 98.7 F (37.1 C) (Oral)   Resp 20   Ht 5\' 4"  (1.626 m)   Wt 103.4 kg   LMP 11/11/2021   SpO2 99%   Breastfeeding Unknown   BMI 39.13 kg/m    Physical Exam:  General: alert and cooperative Breasts: soft/nontender CV: RRR Pulm: nl effort Abdomen: soft, non-tender Uterine Fundus: firm Incision: n/a Perineum: minimal edema, repair well approximated Lochia: appropriate DVT Evaluation: No evidence of DVT seen on physical exam. Edinburgh:     08/24/2022    2:10 PM  Edinburgh Postnatal Depression Scale Screening Tool  I have been able to laugh and see the funny side of things. 0  I have looked forward with enjoyment to things. 0  I have blamed myself unnecessarily when things went wrong. 0  I have been anxious or worried for no good reason. 0  I have felt scared or panicky for no good reason. 0  Things have been getting on top of me. 0  I have been so unhappy that I have had difficulty sleeping. 0  I have felt sad or miserable. 0  I have been so unhappy that I have been crying. 0  The thought of harming myself has occurred to me. 0  Edinburgh Postnatal Depression Scale Total 0     Recent Labs    08/23/22 0932 08/24/22 0718  HGB 10.7* 9.0*  HCT 33.5* 28.4*  WBC 13.5* 17.2*  PLT 204 187    Assessment/Plan: 26 y.o. 25 postpartum day # 1  -Continue routine postpartum care -Lactation consult PRN for breastfeeding   -Acute blood loss anemia - hemodynamically stable and asymptomatic; start PO ferrous sulfate BID with stool softeners  -Immunization status:  all  immunizations up to date  Disposition: Continue inpatient postpartum care    LOS: 2 days   Nadav Swindell, CNM 08/25/2022, 12:01 PM

## 2022-08-26 MED ORDER — ACETAMINOPHEN 325 MG PO TABS: 650.0000 mg | ORAL_TABLET | ORAL | Status: DC | PRN

## 2022-08-26 MED ORDER — IBUPROFEN 600 MG PO TABS: 600.0000 mg | ORAL_TABLET | Freq: Four times a day (QID) | ORAL | 0 refills | Status: DC | PRN

## 2022-08-26 NOTE — Lactation Note (Addendum)
This note was copied from a baby's chart. Lactation Consultation Note  Patient Name: Alexandra Haney WCBJS'E Date: 08/26/2022 Reason for consult: Follow-up assessment Age:26 years  Maternal Data See initial consult note.  Today mom reports she would like to attempt a breastfeeding with LC's assistance.Mom reports she plans to pump with her electric breastpump when she goes home and try to establish a milk supply.She would like to put the baby to breast  today to keep open the option to breastfeed in addition to bottle feeding.  Has patient been taught Hand Expression?: Yes Does the patient have breastfeeding experience prior to this delivery?: No  Feeding Mother's Current Feeding Choice: Breast Milk and Formula Nipple Type: Slow - flow  Assisted mom with breastfeeding maximizing position and latch techniques. Baby did latch and required mom to massage her breast to keep him actively sucking at the breast. A few swallows were noted. He became fussy after 5 minutes.LC used some formula drips at the breast to keep hoim interested which he gave a couple more suck bursts and become fussy detaching from the breast.Discussed with mom baby has been used to the flow of the bottle and taking in good volumes. Mom verbalized she will work on establishing her supply. Recommended mom goal for 8 pump sessions/24 hours and to consider a few feeds per day to offer baby to breast first before offering additional milk at the feeding.  LATCH Score Latch: Repeated attempts needed to sustain latch, nipple held in mouth throughout feeding, stimulation needed to elicit sucking reflex.  Audible Swallowing: A few with stimulation  Type of Nipple: Everted at rest and after stimulation  Comfort (Breast/Nipple): Soft / non-tender  Hold (Positioning): Assistance needed to correctly position infant at breast and maintain latch.  LATCH Score: 7   Lactation Tools Discussed/Used   DEBP  Interventions Interventions: Breast feeding basics reviewed;Assisted with latch;Breast massage;Hand express;Adjust position;Support pillows;Education;Hand pump Reviewed milk breastmilk storage guidelines for the normal newborn at home.  Discharge Discharge Education: Engorgement and breast care;Warning signs for feeding baby (Mom will bring baby for [facility] to [facility]. Mom understands she can call [facility] lactation if she needs additional lactation help once she goes home.) Pump: Personal;Manual (Mom has an Mining engineer breastpump for home use.)  Consult Status Consult Status: Complete Date: 08/26/22 Follow-up type: In-patient  Update provided to care nurse.  Fuller Song 08/26/2022, 10:27 AM

## 2022-08-26 NOTE — Anesthesia Postprocedure Evaluation (Signed)
Anesthesia Post Note  Patient: Clinical biochemist  Procedure(s) Performed: AN AD HOC LABOR EPIDURAL  Patient location during evaluation: Mother Baby Anesthesia Type: Epidural Level of consciousness: awake, awake and alert and oriented Pain management: pain level controlled Vital Signs Assessment: post-procedure vital signs reviewed and stable Respiratory status: spontaneous breathing, respiratory function stable and nonlabored ventilation Cardiovascular status: blood pressure returned to baseline and stable Postop Assessment: no headache and no backache Anesthetic complications: no   No notable events documented.   Last Vitals:  Vitals:   08/25/22 2331 08/26/22 0824  BP: 115/70 122/77  Pulse: 96 100  Resp: 18 (!) 22  Temp: 36.7 C 36.9 C  SpO2: 99% 98%    Last Pain:  Vitals:   08/26/22 0824  TempSrc: Oral  PainSc:                  Tammi Klippel

## 2022-08-27 LAB — BPAM RBC
Blood Product Expiration Date: 202309212359
Blood Product Expiration Date: 202309212359
Unit Type and Rh: 6200
Unit Type and Rh: 6200

## 2022-08-27 LAB — TYPE AND SCREEN
ABO/RH(D): A POS
Antibody Screen: POSITIVE
Unit division: 0
Unit division: 0

## 2024-05-16 ENCOUNTER — Emergency Department
Admission: EM | Admit: 2024-05-16 | Discharge: 2024-05-16 | Disposition: A | Discharge status: Home / Self Care | Payer: Self-pay | Attending: Emergency Medicine | Admitting: Emergency Medicine

## 2024-05-16 ENCOUNTER — Other Ambulatory Visit: Payer: Self-pay

## 2024-05-16 ENCOUNTER — Encounter: Payer: Self-pay | Admitting: Emergency Medicine

## 2024-05-16 DIAGNOSIS — K120 Recurrent oral aphthae: Secondary | ICD-10-CM | POA: Insufficient documentation

## 2024-05-16 DIAGNOSIS — J039 Acute tonsillitis, unspecified: Secondary | ICD-10-CM | POA: Insufficient documentation

## 2024-05-16 LAB — GROUP A STREP BY PCR: Group A Strep by PCR: NOT DETECTED

## 2024-05-16 MED ORDER — PREDNISONE 10 MG (21) PO TBPK: ORAL_TABLET | ORAL | 0 refills | Status: AC

## 2024-05-16 MED ORDER — LIDOCAINE VISCOUS HCL 2 % MT SOLN
15.0000 mL | Freq: Once | OROMUCOSAL | Status: AC
  Administered 2024-05-16: 15 mL via OROMUCOSAL
  Filled 2024-05-16: qty 15

## 2024-05-16 NOTE — ED Triage Notes (Signed)
 Pt via POV from home. Pt c/o R sided throat pain reports may be one of her tonsils, report white "cottage cheese" stuff coming from that tonsils. Pt is able to swallow but it is painful. Pt is A&Ox4 and NAD, ambulatory to triage with steady gait.

## 2024-05-16 NOTE — ED Provider Notes (Signed)
 [facility] Emergency Department Provider Note     Event Date/Time   First MD Initiated Contact with Patient 05/16/24 1935     (approximate)   History   Sore Throat   HPI  Alexandra Haney is a 28 y.o. female  complains of right sided sore throat.  Patient reports tender right tonsil with what she describes as "cottage cheese", coming from the tonsil.  She is able to swallow and control secretions but notes it is painful but she denies any fevers, chills, sweats.  No sick contacts or recent travel.   Physical Exam   Triage Vital Signs: ED Triage Vitals  Encounter Vitals Group     BP 05/16/24 1612 (!) 119/90     Systolic BP Percentile --      Diastolic BP Percentile --      Pulse Rate 05/16/24 1612 90     Resp 05/16/24 1612 18     Temp 05/16/24 1612 98.1 F (36.7 C)     Temp Source 05/16/24 1612 Oral     SpO2 05/16/24 1612 98 %     Weight 05/16/24 1608 180 lb (81.6 kg)     Height 05/16/24 1608 5\' 2"  (1.575 m)     Head Circumference --      Peak Flow --      Pain Score 05/16/24 1608 7     Pain Loc --      Pain Education --      Exclude from Growth Chart --     Most recent vital signs: Vitals:   05/16/24 1612 05/16/24 1921  BP: (!) 119/90 119/84  Pulse: 90 84  Resp: 18 17  Temp: 98.1 F (36.7 C) 98.4 F (36.9 C)  SpO2: 98% 100%    General Awake, no distress. NAD HEENT NCAT. PERRL. EOMI. No rhinorrhea. Mucous membranes are moist.  Uvula is midline and tonsils are erythematous without significant edema.  Right tonsil appears to have tonsilloliths as well as a possible aphthous ulcer.  No midline shift appreciated.  No brawny sublingual edema noted. CV:  Good peripheral perfusion.  RESP:  Normal effort.  ABD:  No distention.   ED Results / Procedures / Treatments   Labs (all labs ordered are listed, but only abnormal results are displayed) Labs Reviewed  GROUP A STREP BY PCR     EKG   RADIOLOGY   No results  found.   PROCEDURES:  Critical Care performed: No  Procedures   MEDICATIONS ORDERED IN ED: Medications  lidocaine  (XYLOCAINE ) 2 % viscous mouth solution 15 mL (has no administration in time range)     IMPRESSION / MDM / ASSESSMENT AND PLAN / ED COURSE  I reviewed the triage vital signs and the nursing notes.                              Differential diagnosis includes, but is not limited to, strep, tonsillitis, tonsil stones, PTA, aphthous ulcers, viral infection  Patient's presentation is most consistent with acute complicated illness / injury requiring diagnostic workup.  Patient's diagnosis is consistent with unilateral right tonsillitis secondary to an aphthous ulcer.  Patient with reassuring exam and workup at this time.  Strep PCR is negative at this time.  Patient without evidence of a PTA or progressive soft tissue swelling or infectious process of the mouth and throat.  Patient will be discharged home with prescriptions for prednisone and  viscous lidocaine . Patient is to follow up with her PCP or local urgent care as discussed, as needed or otherwise directed. Patient is given ED precautions to return to the ED for any worsening or new symptoms.  FINAL CLINICAL IMPRESSION(S) / ED DIAGNOSES   Final diagnoses:  Tonsillitis  Aphthous ulcer     Rx / DC Orders   ED Discharge Orders          Ordered    predniSONE (STERAPRED UNI-PAK 21 TAB) 10 MG (21) TBPK tablet        05/16/24 1954             Note:  This document was prepared using Dragon voice recognition software and may include unintentional dictation errors.    May Sparks, PA-C 05/16/24 Erleen Hawking, MD 05/16/24 [phone]

## 2024-05-16 NOTE — Discharge Instructions (Addendum)
 Your exam is consistent with an acute tonsillitis due to an aphthous ulcer in the mouth. Take the prescription med as direted. Follow-up with your provider as needed.

## 2024-05-16 NOTE — ED Provider Triage Note (Signed)
 Emergency Medicine Provider Triage Evaluation Note  Alexandra Haney, a 28 y.o. female  was evaluated in triage.  Pt complains of right sided sore throat.  Patient reports tender right tonsil with what she describes as "cottage cheese", coming from the tonsil.  She is able to swallow and control secretions but notes it is painful but she denies any fevers, chills, sweats.  No sick contacts or recent travel..  Review of Systems  Positive: Unilateral sore throat Negative: FCS  Physical Exam  Ht 5\' 2"  (1.575 m)   Wt 81.6 kg   LMP 05/13/2024   BMI 32.92 kg/m  Gen:   Awake, no distress NAD Resp:  Normal effort  MSK:   Moves extremities without difficulty  Other:  Uvula is midline and tonsils are erythematous and enlarged.  on tonsillar stones as well as a right tonsillar aphthous ulcer noted.  Medical Decision Making  Medically screening exam initiated at 4:10 PM.  Appropriate orders placed.  The [employer] was informed that the remainder of the evaluation will be completed by another provider, this initial triage assessment does not replace that evaluation, and the importance of remaining in the ED until their evaluation is complete.  Right-sided sore throat pain x 2 days with no reported fevers, chills, sweats.  No dysphagia or difficulty or controlling secretions.   May Sparks, PA-C 05/16/24 [phone]
